# Patient Record
Sex: Male | Born: 1985
Health system: Southern US, Community
[De-identification: ages and names within clinical notes are randomized; demographics above are authoritative.]

## PROBLEM LIST (undated history)

## (undated) DIAGNOSIS — I82409 Acute embolism and thrombosis of unspecified deep veins of unspecified lower extremity: Secondary | ICD-10-CM

## (undated) DIAGNOSIS — Z8659 Personal history of other mental and behavioral disorders: Secondary | ICD-10-CM

## (undated) DIAGNOSIS — G8929 Other chronic pain: Secondary | ICD-10-CM

## (undated) DIAGNOSIS — J4520 Mild intermittent asthma, uncomplicated: Secondary | ICD-10-CM

## (undated) DIAGNOSIS — M549 Dorsalgia, unspecified: Secondary | ICD-10-CM

## (undated) DIAGNOSIS — Z86718 Personal history of other venous thrombosis and embolism: Secondary | ICD-10-CM

## (undated) DIAGNOSIS — F411 Generalized anxiety disorder: Secondary | ICD-10-CM

## (undated) DIAGNOSIS — Z973 Presence of spectacles and contact lenses: Secondary | ICD-10-CM

## (undated) DIAGNOSIS — G43909 Migraine, unspecified, not intractable, without status migrainosus: Secondary | ICD-10-CM

## (undated) DIAGNOSIS — I2699 Other pulmonary embolism without acute cor pulmonale: Secondary | ICD-10-CM

## (undated) HISTORY — PX: UMBILICAL HERNIA REPAIR: SHX196

## (undated) HISTORY — DX: Dorsalgia, unspecified: M54.9

## (undated) HISTORY — DX: Other chronic pain: G89.29

---
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
## 2006-01-03 ENCOUNTER — Emergency Department (HOSPITAL_COMMUNITY): Admission: EM | Admit: 2006-01-03 | Discharge: 2006-01-03 | Payer: Self-pay | Admitting: Emergency Medicine

## 2006-07-27 ENCOUNTER — Emergency Department (HOSPITAL_COMMUNITY): Admission: EM | Admit: 2006-07-27 | Discharge: 2006-07-27 | Payer: Self-pay | Admitting: Emergency Medicine

## 2007-08-07 ENCOUNTER — Emergency Department (HOSPITAL_COMMUNITY): Admission: EM | Admit: 2007-08-07 | Discharge: 2007-08-07 | Payer: Self-pay | Admitting: Emergency Medicine

## 2007-09-05 ENCOUNTER — Emergency Department (HOSPITAL_COMMUNITY): Admission: EM | Admit: 2007-09-05 | Discharge: 2007-09-05 | Payer: Self-pay | Admitting: Emergency Medicine

## 2007-11-05 ENCOUNTER — Emergency Department (HOSPITAL_COMMUNITY): Admission: EM | Admit: 2007-11-05 | Discharge: 2007-11-05 | Payer: Self-pay | Admitting: Family Medicine

## 2008-03-04 ENCOUNTER — Emergency Department (HOSPITAL_COMMUNITY): Admission: EM | Admit: 2008-03-04 | Discharge: 2008-03-04 | Payer: Self-pay | Admitting: Emergency Medicine

## 2008-08-10 ENCOUNTER — Emergency Department (HOSPITAL_COMMUNITY): Admission: EM | Admit: 2008-08-10 | Discharge: 2008-08-11 | Payer: Self-pay | Admitting: Emergency Medicine

## 2008-09-12 ENCOUNTER — Encounter: Admission: RE | Admit: 2008-09-12 | Discharge: 2008-10-14 | Payer: Self-pay | Admitting: Orthopaedic Surgery

## 2008-12-18 ENCOUNTER — Emergency Department (HOSPITAL_COMMUNITY): Admission: EM | Admit: 2008-12-18 | Discharge: 2008-12-19 | Payer: Self-pay | Admitting: Emergency Medicine

## 2008-12-26 ENCOUNTER — Emergency Department (HOSPITAL_COMMUNITY): Admission: EM | Admit: 2008-12-26 | Discharge: 2008-12-26 | Payer: Self-pay | Admitting: Emergency Medicine

## 2009-01-17 ENCOUNTER — Emergency Department (HOSPITAL_COMMUNITY): Admission: EM | Admit: 2009-01-17 | Discharge: 2009-01-17 | Payer: Self-pay | Admitting: Dietician

## 2009-01-27 ENCOUNTER — Emergency Department (HOSPITAL_COMMUNITY): Admission: EM | Admit: 2009-01-27 | Discharge: 2009-01-27 | Payer: Self-pay | Admitting: Ophthalmology

## 2009-06-06 DIAGNOSIS — Z86711 Personal history of pulmonary embolism: Secondary | ICD-10-CM

## 2009-06-06 DIAGNOSIS — D6859 Other primary thrombophilia: Secondary | ICD-10-CM

## 2009-06-06 DIAGNOSIS — Z7901 Long term (current) use of anticoagulants: Secondary | ICD-10-CM

## 2009-06-06 HISTORY — DX: Personal history of pulmonary embolism: Z86.711

## 2009-06-06 HISTORY — DX: Long term (current) use of anticoagulants: Z79.01

## 2009-06-06 HISTORY — DX: Other primary thrombophilia: D68.59

## 2009-06-24 ENCOUNTER — Encounter (INDEPENDENT_AMBULATORY_CARE_PROVIDER_SITE_OTHER): Payer: Self-pay | Admitting: Internal Medicine

## 2009-06-24 ENCOUNTER — Ambulatory Visit: Payer: Self-pay | Admitting: Vascular Surgery

## 2009-06-24 ENCOUNTER — Inpatient Hospital Stay (HOSPITAL_COMMUNITY): Admission: EM | Admit: 2009-06-24 | Discharge: 2009-06-27 | Payer: Self-pay | Admitting: Emergency Medicine

## 2009-06-25 ENCOUNTER — Encounter: Admission: AD | Admit: 2009-06-25 | Discharge: 2009-06-26 | Payer: Self-pay | Admitting: Internal Medicine

## 2009-06-27 ENCOUNTER — Ambulatory Visit: Payer: Self-pay | Admitting: Oncology

## 2009-07-04 ENCOUNTER — Ambulatory Visit: Payer: Self-pay | Admitting: Internal Medicine

## 2009-07-04 DIAGNOSIS — I2699 Other pulmonary embolism without acute cor pulmonale: Secondary | ICD-10-CM

## 2009-07-04 DIAGNOSIS — I82409 Acute embolism and thrombosis of unspecified deep veins of unspecified lower extremity: Secondary | ICD-10-CM | POA: Insufficient documentation

## 2009-07-04 DIAGNOSIS — F17200 Nicotine dependence, unspecified, uncomplicated: Secondary | ICD-10-CM

## 2009-07-05 ENCOUNTER — Encounter: Payer: Self-pay | Admitting: Internal Medicine

## 2009-07-05 ENCOUNTER — Telehealth: Payer: Self-pay | Admitting: Internal Medicine

## 2009-07-05 ENCOUNTER — Ambulatory Visit (HOSPITAL_COMMUNITY): Admission: RE | Admit: 2009-07-05 | Discharge: 2009-07-05 | Payer: Self-pay | Admitting: Internal Medicine

## 2009-07-10 ENCOUNTER — Telehealth (INDEPENDENT_AMBULATORY_CARE_PROVIDER_SITE_OTHER): Payer: Self-pay | Admitting: *Deleted

## 2009-07-11 ENCOUNTER — Encounter: Payer: Self-pay | Admitting: Internal Medicine

## 2009-07-11 ENCOUNTER — Ambulatory Visit: Payer: Self-pay | Admitting: Physician Assistant

## 2009-07-11 DIAGNOSIS — D6859 Other primary thrombophilia: Secondary | ICD-10-CM | POA: Insufficient documentation

## 2009-07-11 DIAGNOSIS — N411 Chronic prostatitis: Secondary | ICD-10-CM | POA: Insufficient documentation

## 2009-07-11 LAB — CONVERTED CEMR LAB
ALT: 45 units/L (ref 0–53)
Albumin: 4.3 g/dL (ref 3.5–5.2)
Alkaline Phosphatase: 49 units/L (ref 39–117)
Basophils Absolute: 0 10*3/uL (ref 0.0–0.1)
Basophils Relative: 0 % (ref 0–1)
Benzodiazepines.: NEGATIVE
CO2: 26 meq/L (ref 19–32)
Chlamydia, Swab/Urine, PCR: NEGATIVE
Eosinophils Relative: 1 % (ref 0–5)
HCT: 46.9 % (ref 39.0–52.0)
Hemoglobin: 15.9 g/dL (ref 13.0–17.0)
MCHC: 33.9 g/dL (ref 30.0–36.0)
MCV: 94.2 fL (ref 78.0–100.0)
Marijuana Metabolite: POSITIVE — AB
Monocytes Absolute: 0.4 10*3/uL (ref 0.1–1.0)
Opiate Screen, Urine: NEGATIVE
Phencyclidine (PCP): NEGATIVE
Propoxyphene: NEGATIVE
RBC: 4.98 M/uL (ref 4.22–5.81)
Sodium: 140 meq/L (ref 135–145)
Total Bilirubin: 0.5 mg/dL (ref 0.3–1.2)

## 2009-07-12 ENCOUNTER — Encounter: Payer: Self-pay | Admitting: Physician Assistant

## 2009-07-14 ENCOUNTER — Encounter (INDEPENDENT_AMBULATORY_CARE_PROVIDER_SITE_OTHER): Payer: Self-pay | Admitting: *Deleted

## 2009-07-18 ENCOUNTER — Encounter (INDEPENDENT_AMBULATORY_CARE_PROVIDER_SITE_OTHER): Payer: Self-pay | Admitting: Nurse Practitioner

## 2009-07-18 ENCOUNTER — Encounter: Payer: Self-pay | Admitting: Physician Assistant

## 2009-07-19 ENCOUNTER — Encounter: Payer: Self-pay | Admitting: Physician Assistant

## 2009-07-24 ENCOUNTER — Encounter: Payer: Self-pay | Admitting: Physician Assistant

## 2009-07-28 ENCOUNTER — Encounter: Payer: Self-pay | Admitting: Physician Assistant

## 2009-08-08 ENCOUNTER — Ambulatory Visit: Payer: Self-pay | Admitting: Internal Medicine

## 2009-08-10 ENCOUNTER — Ambulatory Visit: Payer: Self-pay | Admitting: Physician Assistant

## 2009-08-10 ENCOUNTER — Ambulatory Visit: Payer: Self-pay | Admitting: Oncology

## 2009-08-11 LAB — CONVERTED CEMR LAB: GC Probe Amp, Genital: NEGATIVE

## 2009-08-14 ENCOUNTER — Encounter: Payer: Self-pay | Admitting: Physician Assistant

## 2009-08-14 ENCOUNTER — Encounter: Payer: Self-pay | Admitting: Internal Medicine

## 2009-08-14 LAB — CBC WITH DIFFERENTIAL/PLATELET
BASO%: 0.9 % (ref 0.0–2.0)
EOS%: 1.7 % (ref 0.0–7.0)
Eosinophils Absolute: 0.1 10*3/uL (ref 0.0–0.5)
LYMPH%: 49.8 % — ABNORMAL HIGH (ref 14.0–49.0)
MCHC: 34.2 g/dL (ref 32.0–36.0)
MCV: 98.1 fL — ABNORMAL HIGH (ref 79.3–98.0)
MONO%: 12.6 % (ref 0.0–14.0)
NEUT#: 1.6 10*3/uL (ref 1.5–6.5)
Platelets: 168 10*3/uL (ref 140–400)
RBC: 4.38 10*6/uL (ref 4.20–5.82)
RDW: 14 % (ref 11.0–14.6)

## 2009-08-17 LAB — COMPREHENSIVE METABOLIC PANEL
ALT: 19 U/L (ref 0–53)
AST: 30 U/L (ref 0–37)
Albumin: 4.3 g/dL (ref 3.5–5.2)
Alkaline Phosphatase: 45 U/L (ref 39–117)
Potassium: 4.2 mEq/L (ref 3.5–5.3)
Sodium: 138 mEq/L (ref 135–145)
Total Bilirubin: 0.5 mg/dL (ref 0.3–1.2)
Total Protein: 6.6 g/dL (ref 6.0–8.3)

## 2009-08-17 LAB — HYPERCOAGULABLE PANEL, COMPREHENSIVE
Anticardiolipin IgG: 1 GPL U/mL (ref <23)
Beta-2 Glyco I IgG: 0 G Units (ref <20)
DRVVT 1:1 Mix: 66.2 secs — ABNORMAL HIGH (ref 36.2–44.3)
DRVVT: 108.5 secs — ABNORMAL HIGH (ref 36.2–44.3)
Drvvt confirmation: 1.76 Ratio — ABNORMAL HIGH (ref <1.21)
Homocysteine: 8.6 umol/L (ref 4.0–15.4)
PTT Lupus Anticoagulant: 52.5 secs — ABNORMAL HIGH (ref 32.0–43.4)
Protein C Activity: 127 % (ref 75–133)
Protein C, Total: 67 % — ABNORMAL LOW (ref 70–140)
Protein S Ag, Total: 49 % — ABNORMAL LOW (ref 70–140)

## 2009-08-31 ENCOUNTER — Ambulatory Visit: Payer: Self-pay | Admitting: Internal Medicine

## 2009-08-31 ENCOUNTER — Telehealth (INDEPENDENT_AMBULATORY_CARE_PROVIDER_SITE_OTHER): Payer: Self-pay | Admitting: *Deleted

## 2009-08-31 ENCOUNTER — Ambulatory Visit: Payer: Self-pay

## 2009-08-31 ENCOUNTER — Encounter: Payer: Self-pay | Admitting: Internal Medicine

## 2009-09-08 ENCOUNTER — Telehealth: Payer: Self-pay | Admitting: Internal Medicine

## 2009-09-27 ENCOUNTER — Encounter: Payer: Self-pay | Admitting: Physician Assistant

## 2009-10-07 ENCOUNTER — Emergency Department (HOSPITAL_COMMUNITY): Admission: EM | Admit: 2009-10-07 | Discharge: 2009-10-07 | Payer: Self-pay | Admitting: Emergency Medicine

## 2009-10-07 ENCOUNTER — Encounter: Payer: Self-pay | Admitting: Physician Assistant

## 2009-10-13 ENCOUNTER — Ambulatory Visit: Payer: Self-pay | Admitting: Physician Assistant

## 2009-10-13 DIAGNOSIS — M79609 Pain in unspecified limb: Secondary | ICD-10-CM | POA: Insufficient documentation

## 2009-11-02 ENCOUNTER — Ambulatory Visit: Payer: Self-pay | Admitting: Oncology

## 2009-11-02 ENCOUNTER — Ambulatory Visit (HOSPITAL_COMMUNITY): Admission: RE | Admit: 2009-11-02 | Discharge: 2009-11-02 | Payer: Self-pay | Admitting: Physician Assistant

## 2009-11-02 ENCOUNTER — Ambulatory Visit: Payer: Self-pay | Admitting: Internal Medicine

## 2009-11-02 ENCOUNTER — Telehealth: Payer: Self-pay | Admitting: Physician Assistant

## 2009-11-20 ENCOUNTER — Telehealth: Payer: Self-pay | Admitting: Physician Assistant

## 2009-11-23 ENCOUNTER — Encounter: Payer: Self-pay | Admitting: Physician Assistant

## 2009-11-23 ENCOUNTER — Encounter: Admission: RE | Admit: 2009-11-23 | Discharge: 2009-12-20 | Payer: Self-pay | Admitting: Internal Medicine

## 2009-11-24 ENCOUNTER — Ambulatory Visit: Payer: Self-pay | Admitting: Physician Assistant

## 2009-11-24 DIAGNOSIS — T7840XA Allergy, unspecified, initial encounter: Secondary | ICD-10-CM | POA: Insufficient documentation

## 2009-11-24 DIAGNOSIS — R209 Unspecified disturbances of skin sensation: Secondary | ICD-10-CM | POA: Insufficient documentation

## 2009-11-25 ENCOUNTER — Encounter: Payer: Self-pay | Admitting: Physician Assistant

## 2009-11-28 LAB — CONVERTED CEMR LAB
Basophils Absolute: 0 10*3/uL (ref 0.0–0.1)
HCT: 45 % (ref 39.0–52.0)
MCV: 95.5 fL (ref 78.0–100.0)
Monocytes Relative: 12 % (ref 3–12)
Neutro Abs: 1.6 10*3/uL — ABNORMAL LOW (ref 1.7–7.7)
Neutrophils Relative %: 30 % — ABNORMAL LOW (ref 43–77)
Platelets: 160 10*3/uL (ref 150–400)
RBC Folate: 434 ng/mL (ref 180–600)
RDW: 13.4 % (ref 11.5–15.5)
Vitamin B-12: 416 pg/mL (ref 211–911)

## 2009-11-29 ENCOUNTER — Encounter (INDEPENDENT_AMBULATORY_CARE_PROVIDER_SITE_OTHER): Payer: Self-pay | Admitting: *Deleted

## 2009-12-20 ENCOUNTER — Telehealth (INDEPENDENT_AMBULATORY_CARE_PROVIDER_SITE_OTHER): Payer: Self-pay | Admitting: Nurse Practitioner

## 2009-12-20 ENCOUNTER — Encounter: Payer: Self-pay | Admitting: Physician Assistant

## 2009-12-25 ENCOUNTER — Ambulatory Visit: Payer: Self-pay | Admitting: Oncology

## 2009-12-26 ENCOUNTER — Encounter: Payer: Self-pay | Admitting: Physician Assistant

## 2009-12-26 LAB — CBC WITH DIFFERENTIAL/PLATELET
Basophils Absolute: 0 10*3/uL (ref 0.0–0.1)
Eosinophils Absolute: 0.1 10*3/uL (ref 0.0–0.5)
HCT: 44.1 % (ref 38.4–49.9)
HGB: 15.5 g/dL (ref 13.0–17.1)
LYMPH%: 58.1 % — ABNORMAL HIGH (ref 14.0–49.0)
MCV: 94.2 fL (ref 79.3–98.0)
MONO#: 0.5 10*3/uL (ref 0.1–0.9)
MONO%: 9.7 % (ref 0.0–14.0)
NEUT#: 1.5 10*3/uL (ref 1.5–6.5)
Platelets: 154 10*3/uL (ref 140–400)
WBC: 4.9 10*3/uL (ref 4.0–10.3)

## 2009-12-26 LAB — PROTHROMBIN TIME
INR: 1.11 (ref <1.50)
Prothrombin Time: 14.5 seconds (ref 11.6–15.2)

## 2009-12-26 LAB — APTT: aPTT: 33 seconds (ref 24–37)

## 2010-01-03 ENCOUNTER — Encounter: Payer: Self-pay | Admitting: Physician Assistant

## 2010-01-03 ENCOUNTER — Encounter: Payer: Self-pay | Admitting: Internal Medicine

## 2010-01-03 LAB — CBC WITH DIFFERENTIAL/PLATELET
Basophils Absolute: 0 10*3/uL (ref 0.0–0.1)
EOS%: 1.8 % (ref 0.0–7.0)
Eosinophils Absolute: 0.1 10*3/uL (ref 0.0–0.5)
HGB: 16.3 g/dL (ref 13.0–17.1)
MCH: 33 pg (ref 27.2–33.4)
MONO%: 10.3 % (ref 0.0–14.0)
NEUT#: 1.6 10*3/uL (ref 1.5–6.5)
RBC: 4.94 10*6/uL (ref 4.20–5.82)
RDW: 13.4 % (ref 11.0–14.6)
lymph#: 2.4 10*3/uL (ref 0.9–3.3)
nRBC: 0 % (ref 0–0)

## 2010-01-03 LAB — PROTIME-INR
INR: 1.3 — ABNORMAL LOW (ref 2.00–3.50)
Protime: 15.6 Seconds — ABNORMAL HIGH (ref 10.6–13.4)

## 2010-01-04 ENCOUNTER — Telehealth: Payer: Self-pay | Admitting: Internal Medicine

## 2010-01-05 ENCOUNTER — Encounter: Payer: Self-pay | Admitting: Physician Assistant

## 2010-01-10 LAB — PROTIME-INR
INR: 2.1 (ref 2.00–3.50)
Protime: 25.2 Seconds — ABNORMAL HIGH (ref 10.6–13.4)

## 2010-01-10 LAB — CBC WITH DIFFERENTIAL/PLATELET
BASO%: 0.5 % (ref 0.0–2.0)
HCT: 43.9 % (ref 38.4–49.9)
MCHC: 33.4 g/dL (ref 32.0–36.0)
MONO#: 0.5 10*3/uL (ref 0.1–0.9)
NEUT%: 31.4 % — ABNORMAL LOW (ref 39.0–75.0)
RDW: 13.7 % (ref 11.0–14.6)
WBC: 3.8 10*3/uL — ABNORMAL LOW (ref 4.0–10.3)
lymph#: 2.1 10*3/uL (ref 0.9–3.3)

## 2010-01-24 ENCOUNTER — Ambulatory Visit: Payer: Self-pay | Admitting: Oncology

## 2010-01-24 ENCOUNTER — Encounter: Payer: Self-pay | Admitting: Physician Assistant

## 2010-01-24 ENCOUNTER — Encounter: Payer: Self-pay | Admitting: Internal Medicine

## 2010-01-24 LAB — PROTIME-INR: Protime: 16.8 Seconds — ABNORMAL HIGH (ref 10.6–13.4)

## 2010-02-05 ENCOUNTER — Ambulatory Visit: Payer: Self-pay | Admitting: Physician Assistant

## 2010-02-05 DIAGNOSIS — M545 Low back pain: Secondary | ICD-10-CM | POA: Insufficient documentation

## 2010-02-05 DIAGNOSIS — L708 Other acne: Secondary | ICD-10-CM | POA: Insufficient documentation

## 2010-02-06 LAB — PROTIME-INR
INR: 1.9 — ABNORMAL LOW (ref 2.00–3.50)
Protime: 22.8 Seconds — ABNORMAL HIGH (ref 10.6–13.4)

## 2010-02-19 ENCOUNTER — Encounter: Payer: Self-pay | Admitting: Physician Assistant

## 2010-02-20 ENCOUNTER — Encounter: Payer: Self-pay | Admitting: Physician Assistant

## 2010-02-20 LAB — PROTIME-INR: INR: 1.5 — ABNORMAL LOW (ref 2.00–3.50)

## 2010-02-23 ENCOUNTER — Ambulatory Visit: Payer: Self-pay | Admitting: Oncology

## 2010-02-27 LAB — PROTIME-INR

## 2010-03-14 LAB — PROTIME-INR
INR: 2 (ref 2.00–3.50)
Protime: 24 Seconds — ABNORMAL HIGH (ref 10.6–13.4)

## 2010-03-20 ENCOUNTER — Emergency Department (HOSPITAL_COMMUNITY)
Admission: EM | Admit: 2010-03-20 | Discharge: 2010-03-20 | Payer: Self-pay | Source: Home / Self Care | Admitting: Emergency Medicine

## 2010-03-21 LAB — PROTIME-INR
INR: 1.6 — ABNORMAL LOW (ref 2.00–3.50)
Protime: 19.2 Seconds — ABNORMAL HIGH (ref 10.6–13.4)

## 2010-03-22 ENCOUNTER — Ambulatory Visit
Admission: RE | Admit: 2010-03-22 | Discharge: 2010-03-22 | Payer: Self-pay | Source: Home / Self Care | Admitting: Oncology

## 2010-03-22 ENCOUNTER — Ambulatory Visit: Payer: Self-pay | Admitting: Vascular Surgery

## 2010-03-22 ENCOUNTER — Encounter: Payer: Self-pay | Admitting: Oncology

## 2010-03-26 ENCOUNTER — Ambulatory Visit: Payer: Self-pay | Admitting: Oncology

## 2010-04-12 LAB — PROTIME-INR: INR: 1.5 — ABNORMAL LOW (ref 2.00–3.50)

## 2010-04-25 ENCOUNTER — Ambulatory Visit: Payer: Self-pay | Admitting: Oncology

## 2010-04-26 LAB — PROTIME-INR: INR: 1.8 — ABNORMAL LOW (ref 2.00–3.50)

## 2010-05-03 LAB — PROTIME-INR: Protime: 15.6 Seconds — ABNORMAL HIGH (ref 10.6–13.4)

## 2010-05-25 ENCOUNTER — Ambulatory Visit: Payer: Self-pay | Admitting: Oncology

## 2010-06-05 LAB — PROTIME-INR
INR: 1.5 — ABNORMAL LOW (ref 2.00–3.50)
Protime: 18 Seconds — ABNORMAL HIGH (ref 10.6–13.4)

## 2010-06-07 NOTE — Letter (Signed)
Summary: *HSN Results Follow up  [facility]  [address] [place], [place] [address]   Phone: [phone]  Fax: [phone]      11/29/2009   Haruo JEVANTE HOLLIBAUGH [address] [place], [place]  [address]   Dear  Mr. Bartlett Wallis,                            ____S.Drinkard,FNP   ____D. Gore,FNP       ____B. McPherson,MD   ____V. Rankins,MD    ____E. Mulberry,MD    ____N. Daphine Deutscher, FNP  ____D. Reche Dixon, MD    ____K. Philipp Deputy, MD    ____Other     This letter is to inform you that your recent test(s):  _______Pap Smear    __X_____Lab Test     _______X-ray    ____X___ is within acceptable limits  _______ requires a medication change  _______ requires a follow-up lab visit  _______ requires a follow-up visit with your provider   Comments:   Your blood counts and vitamin levels are normal.       _________________________________________________________ If you have any questions, please contact our office                     Sincerely,  Arm[place] Shannon [facility]

## 2010-06-07 NOTE — Letter (Signed)
Summary: [facility]  [address]   [place], [place] [address]   Phone: [phone]  Fax: [phone]    07/11/2009  [name] [address] [place], [place]  [address]  Dear [name],           Sincerely,   Kalman Shan MD

## 2010-06-07 NOTE — Progress Notes (Signed)
  Phone Note Outgoing Call   Summary of Call: xrays done recently ok suggest he go to PT referral in system send to Arna Medici once he is notified Initial call taken by: Brynda Rim,  November 02, 2009 10:23 PM  Follow-up for Phone Call        Left message on answering machine for pt to call back.Marland KitchenMarland KitchenMarland KitchenArmenia Shannon  November 03, 2009 9:32 AM   pt is aware... [place] Shannon  November 07, 2009 8:19 AM        Impression & Recommendations:  Problem # 1:  ARM PAIN, RIGHT (ICD-729.5)  refer to PT xrays neg. except poss old fx in hand (well healed)  Orders: Physical Therapy Referral (PT)  Complete Medication List: 1)  Ibuprofen 200 Mg Tabs (Ibuprofen) .... As needed 2)  Rivaroxaban 20mg   .... Take 1 tablet by mouth once a day  (study drug) 3)  Flexeril 10 Mg Tabs (Cyclobenzaprine hcl) .... Take 1 tab by mouth at bedtime as needed for muscle pain 4)  Tramadol Hcl 50 Mg Tabs (Tramadol hcl) .... Take 1 tablet by mouth three times a day as needed for severe pain

## 2010-06-07 NOTE — Letter (Signed)
Summary: Handout Printed  Printed Handout:  - Acne, Brief
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
## 2010-06-07 NOTE — Progress Notes (Signed)
Summary: RIGHT SIDE OF FACE SWOLLEN  Phone Note Call from Patient Call back at Home Phone [phone]   Reason for Call: Talk to Nurse Summary of Call: WEAVER PT. Nicholas Glover NOTICED THIS MORNING WHEN HE GOT OFF WORK THE THE RIGHT SIDE OF HIS FACE IS SWOLLEN AND HE IS AKSING THE QUESTION IS IT SERIOUS OR IF IT HAS SOMETHING TO DO WITH THE BLOOD CLOTS OR THE PAIN IN HIS ARM. Initial call taken by: Nicholas Glover,  November 20, 2009 10:02 AM  Follow-up for Phone Call        Left message on answering machine for pt to call back.Marland KitchenMarland KitchenMarland KitchenArmenia Glover  November 20, 2009 11:28 AM   Left message on answering machine for pt to call back.Marland KitchenMarland KitchenMarland KitchenArmenia Glover  November 21, 2009 12:25 PM   Additional Follow-up for Phone Call Additional follow up Details #1::        spoke with pt and he said he has been having pain in his right arm...Marland KitchenMarland Kitchen pt says his face is still swollen a little.... pt has not tried anything to keep swelling down.... 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   Additional Follow-up by: Nicholas Glover,  November 21, 2009 4:27 PM    Additional Follow-up for Phone Call Additional follow up Details #2::    He would need an appt. for me to see him. If worrisome, go to urgent care. Nicholas Newcomer PA-C  November 21, 2009 4:47 PM   pt has appt scheduled....... Nicholas Glover  November 22, 2009 9:25 AM
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
## 2010-06-07 NOTE — Progress Notes (Signed)
Summary: BLOOD TREATMENT  Phone Note Call from Patient Call back at [phone]   Summary of Call: THE PT  WANTS TO MAKE SURE IF HE SHOULD CONTINUE THE BLOOD TREATMENT.  PT WENT TODAY TO [facility] AND SUGGESTED HIM TO CALL PRIMARY PROVIDER. WEAVER PA-C DEPARTMENT. WEAVER PA-C Initial call taken by: Manon Hilding,  December 20, 2009 10:52 AM  Follow-up for Phone Call        Does this need to wait for Lorin Picket to address?  Dutch Quint RN  December 20, 2009 2:20 PM   Additional Follow-up for Phone Call Additional follow up Details #1::        In reviewing notes pt is supposed to speak with  Dr. Donnie Coffin early agust 2011 to determine how long he needs to take coumadin.  he was seen by that provider and had some testing done to determine if he would need lifelong coumadin  (I don't see results of testing in EMR) Additional Follow-up by: Lehman Prom FNP,  December 20, 2009 2:34 PM    Additional Follow-up for Phone Call Additional follow up Details #2::    Advised of provider's response -- pt. will follow-up with Dr. Donnie Coffin.   Follow-up by: Dutch Quint RN,  December 20, 2009 5:03 PM
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
## 2010-06-07 NOTE — Letter (Signed)
Summary: [facility]  [facility]   Imported By: Sherian Rein 02/28/2010 14:55:52  _____________________________________________________________________  External Attachment:    Type:   Image     Comment:   External Document

## 2010-06-07 NOTE — Letter (Signed)
[facility]   Imported By: Lester [place] 09/19/2009 08:41:09  _____________________________________________________________________  External Attachment:    Type:   Image     Comment:   External Document

## 2010-06-07 NOTE — Miscellaneous (Signed)
Summary: Orders Update  Clinical Lists Changes  Orders: Added new Test order of Venous Duplex Lower Extremity (Venous Duplex Lower) - Signed 

## 2010-06-07 NOTE — Progress Notes (Signed)
Summary: nos appt  Phone Note Call from Patient   Caller: [email or web address]  Call For: [email or web address] Summary of Call: [facility] nos from 8/31 to 9/14 @ 3:10p. Initial call taken by: Darletta Moll,  January 04, 2010 2:06 PM     Appended Document: nos appt Candise Bowens, Last OV I told him no need to fu in pulmonary. He needs a coumadin clinic and he needs to see Dr Donnie Coffin in heme to determine lenght of anticoagulation. Pls confirm those 2 with patient and cancel appt to see me unless he wants to   Appended Document: nos appt AT[facility] home number but received a message stating that number was not valid. I also called work number but received a Engineer, technical sales. I WCB.   Appended Document: nos appt spoke with pt and advised. He is seeing Dr. Donnie Coffin and coumadin clinic. I advised no need to follow-up with MR unless he has pulmonary issues. Appt cancelled for 01-17-10.

## 2010-06-07 NOTE — Letter (Signed)
Summary: REQUESTING RECORDS FROM [facility]  REQUESTING RECORDS FROM [facility]   Imported By: Arta Bruce 09/25/2009 11:12:37  _____________________________________________________________________  External Attachment:    Type:   Image     Comment:   External Document
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
## 2010-06-07 NOTE — Progress Notes (Signed)
Summary: u/s results  Phone Note From Other Clinic   Caller: Dois Davenport with [facility] Call For: MR Summary of Call: Dois Davenport called with [place] report of pt legs. She states all clots are clear except for the one at pt left posterior calf, but this is believed to be a superficial clot, not a dvt. they wanted to know if ok tolet pt leave. I aske KC the doc on call today and he staets ok for pt to leave. Dois Davenport notified.  Initial call taken by: Carron Curie CMA,  July 05, 2009 11:32 AM     Appended Document: u/s results ok for patient to leave. Please tell patient to take the pain meds ibuprofen or tylenol as advised and rest of clot shuld clear up with time. Pls tell him to keep appt withDr Donnie Coffin. I spoke to Dr. Donnie Coffin early hours today. He said he did not do note inpatient becuase patient left/leaving. He plans to see patient soon.   Appended Document: u/s results pt advised.
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
## 2010-06-07 NOTE — Letter (Signed)
Summary:  Cancer Center  [facility]   Imported By: Lennie Odor 01/22/2010 14:04:35  _____________________________________________________________________  External Attachment:    Type:   Image     Comment:   External Document
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
## 2010-06-07 NOTE — Progress Notes (Signed)
Summary: return to work/ restrictions  Phone Note Call from Patient Call back at Home Phone [phone]   Caller: Patient Call For: ramaswamy Summary of Call: pt wants to know 1) when he is to return to work and 2) if there are any restrictions. he will need a note for this. call pt (he just left our office).  pt was seen 07/04/2009  Initial call taken by: Tivis Ringer, CNA,  July 10, 2009 11:48 AM  Follow-up for Phone Call        please advise. Carron Curie CMA  July 10, 2009 12:07 PM   Additional Follow-up for Phone Call Additional follow up Details #1::        done. see separate letter. Additional Follow-up by: Kalman Shan MD,  July 11, 2009 12:08 AM    Additional Follow-up for Phone Call Additional follow up Details #2::    letter printed and is at front desk for pt to pick up.  pt aware.  Aundra Millet Reynolds LPN  July 12, 7423 8:48 AM
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
## 2010-06-07 NOTE — Letter (Signed)
[facility]  [facility]   Imported By: Arta Bruce 02/20/2010 15:29:29  _____________________________________________________________________  External Attachment:    Type:   Image     Comment:   External Document

## 2010-06-07 NOTE — Progress Notes (Signed)
Summary: f/u visit for LLE pain, research patient, doppler scheduled  Phone Note Call from Patient   Caller: Patient Call For: Parrett Summary of Call: Patient had follow up visit with research coordinator, complained of LLE pain.  Coordinator spoke with Dr. Marchelle Gearing; MD advised patient to come to hospital for a doppler and to f/u with T. Parrett, NR. Initial call taken by: Chase Caller,  August 31, 2009 8:47 AM  Follow-up for Phone Call        Per TP- okay to send order for dopplers and then followup with her afterwards. I spoke with the pt and he agrees to this plan.  Order was sent to [facility]. Follow-up by: Vernie Murders,  August 31, 2009 10:03 AM

## 2010-06-07 NOTE — Miscellaneous (Signed)
Summary: Rehab Report/initial summary  Rehab Report/initial summary   Imported By: Arta Bruce 11/27/2009 10:45:23  _____________________________________________________________________  External Attachment:    Type:   Image     Comment:   External Document

## 2010-06-07 NOTE — Miscellaneous (Signed)
  Clinical Lists Changes  Problems: Assessed PRIMARY HYPERCOAGULABLE STATE as comment only - Dr. 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 Donnie Coffin rec lifelong anticoag  Complete Medication List: 1)  Ibuprofen 200 Mg Tabs (Ibuprofen) .... As needed 2)  Flexeril 10 Mg Tabs (Cyclobenzaprine hcl) .... Take 1 tab by mouth at bedtime as needed for muscle pain 3)  Tramadol Hcl 50 Mg Tabs (Tramadol hcl) .... Take 1 tablet by mouth three times a day as needed for severe pain 4)  Arixtra 7.5 Mg/0.20ml Soln (Fondaparinux sodium) 5)  Coumadin 7.5 Mg Tabs (Warfarin sodium)

## 2010-06-07 NOTE — Miscellaneous (Signed)
Summary: DISCHARGE SUMMARY  DISCHARGE SUMMARY   Imported By: Arta Bruce 01/01/2010 16:58:54  _____________________________________________________________________  External Attachment:    Type:   Image     Comment:   External Document

## 2010-06-07 NOTE — Letter (Signed)
Summary: [facility]  [address]   [place], [place] [address]   Phone: [phone]  Fax: [phone]    07/10/2009  Nicholas Glover [address] [place], [place]  [address]  Dear Mr. 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 For diagnosis of Pulmonary Embolism on blood thinner treatment the restrictions are mainly as follows. The first is to  avoid any activity that can put you at risk of injury, trauma, fall or being hurt or something falling on you. This is  because with the blood thinner you can bleed easily. So, you have to be cautious of such activities. For example you do not want to play basketball or football but walking, running should be okay. Be extra careful for example if the floor is wet. You do not want to slip and fall. Routine work is okay.  Also, you do not want to be sedantary and sitting idle. This will increase the risk for blood clots.   I hope this clarifes. 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

## 2010-06-07 NOTE — Letter (Signed)
[facility]  [facility]   Imported By: Arta Bruce 02/23/2010 12:43:53  _____________________________________________________________________  External Attachment:    Type:   Image     Comment:   External Document
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
## 2010-06-07 NOTE — Letter (Signed)
Summary: MAILED REQUESTED RECORDS TO [employer] [facility]  MAILED REQUESTED RECORDS TO [employer] [facility]   Imported By: Arta Bruce 02/19/2010 12:50:54  _____________________________________________________________________  External Attachment:    Type:   Image     Comment:   External Document

## 2010-06-07 NOTE — Letter (Signed)
Summary[facility]  [facility]   Imported By: Arta Bruce 01/11/2010 15:20:30  _____________________________________________________________________  External Attachment:    Type:   Image     Comment:   External Document

## 2010-06-07 NOTE — Letter (Signed)
Summary: REQUESTING RECORDS FROM [facility]  REQUESTING RECORDS FROM  [facility]   Imported By: Arta Bruce 09/25/2009 11:06:55  _____________________________________________________________________  External Attachment:    Type:   Image     Comment:   External Document
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
## 2010-06-07 NOTE — Letter (Signed)
Summary: RECEIVED RECORDS FROM [facility] RECORDS FROM [facility]   Imported By: Arta Bruce 10/09/2009 11:36:08  _____________________________________________________________________  External Attachment:    Type:   Image     Comment:   External Document

## 2010-06-07 NOTE — Letter (Signed)
Summary: [facility]/NEW PT EVAL  [facility]NEW PT EVAL   Imported By: Arta Bruce 09/28/2009 11:27:46  _____________________________________________________________________  External Attachment:    Type:   Image     Comment:   External Document

## 2010-06-07 NOTE — Letter (Signed)
Summary: [facility]//PROGRESS NOTE  [facility]//PROGRESS NOTE   Imported By: Arta Bruce 02/20/2010 15:27:27  _____________________________________________________________________  External Attachment:    Type:   Image     Comment:   External Document

## 2010-06-07 NOTE — Letter (Signed)
Summary: *HSN Results Follow up  [facility]  [address] [place], [place] [address]   Phone: [phone]  Fax: [phone]      07/14/2009   Nicholas Glover [address] [place], [place]  [address]   Dear  [name],                            ____S.Drinkard,FNP   ____D. Gore,FNP       ____B. McPherson,MD   ____V. Rankins,MD    ____E. Mulberry,MD    ____N. Daphine Deutscher, FNP  ____D. Reche Dixon, MD    ____K. Philipp Deputy, MD    ____Other     This letter is to inform you that your recent test(s):  _______Pap Smear    __X_____Lab Test     _______X-ray    ___X____ is within acceptable limits  _______ requires a medication change  _______ requires a follow-up lab visit  _______ requires a follow-up visit with your provider   Comments:       _________________________________________________________ If you have any questions, please contact our office                     Sincerely,  Armenia Shannon [facility]

## 2010-06-07 NOTE — Letter (Signed)
Summary: PAIN CLINIC NOTES FOR BACK PAIN  PAIN CLINIC NOTES FOR BACK PAIN   Imported By: Arta Bruce 08/11/2009 10:22:48  _____________________________________________________________________  External Attachment:    Type:   Image     Comment:   External Document

## 2010-06-07 NOTE — Miscellaneous (Signed)
  Clinical Lists Changes  Medications: Changed medication from COUMADIN 5 MG TABS (WARFARIN SODIUM) twice daily to COUMADIN 10 MG TABS (WARFARIN SODIUM) Take 1 tablet by mouth once a day

## 2010-06-07 NOTE — Progress Notes (Signed)
Summary: no dvt  ---- Converted from flag ---- ---- 08/31/2009 11:41 AM, Missy Al-Rammal, RVT, RDCS wrote: No evidence of acute OR chronic left leg DVT or superficial thrombus. ------------------------------

## 2010-06-12 ENCOUNTER — Emergency Department (HOSPITAL_COMMUNITY): Payer: Self-pay

## 2010-06-12 ENCOUNTER — Emergency Department (HOSPITAL_COMMUNITY)
Admission: EM | Admit: 2010-06-12 | Discharge: 2010-06-12 | Disposition: A | Discharge status: Home / Self Care | Payer: Self-pay | Attending: Emergency Medicine | Admitting: Emergency Medicine

## 2010-06-12 DIAGNOSIS — I4510 Unspecified right bundle-branch block: Secondary | ICD-10-CM | POA: Insufficient documentation

## 2010-06-12 DIAGNOSIS — Z86718 Personal history of other venous thrombosis and embolism: Secondary | ICD-10-CM | POA: Insufficient documentation

## 2010-06-12 DIAGNOSIS — Z86711 Personal history of pulmonary embolism: Secondary | ICD-10-CM | POA: Insufficient documentation

## 2010-06-12 DIAGNOSIS — R0789 Other chest pain: Secondary | ICD-10-CM | POA: Insufficient documentation

## 2010-06-12 LAB — POCT I-STAT, CHEM 8
BUN: 12 mg/dL (ref 6–23)
Creatinine, Ser: 1.1 mg/dL (ref 0.4–1.5)
Hemoglobin: 15 g/dL (ref 13.0–17.0)
Potassium: 3.7 mEq/L (ref 3.5–5.1)
Sodium: 140 mEq/L (ref 135–145)

## 2010-06-12 LAB — PROTIME-INR
INR: 1.06 (ref 0.00–1.49)
Prothrombin Time: 14 seconds (ref 11.6–15.2)

## 2010-06-12 MED ORDER — IOHEXOL 300 MG/ML  SOLN
100.0000 mL | Freq: Once | INTRAMUSCULAR | Status: AC | PRN
  Administered 2010-06-12: 100 mL via INTRAVENOUS

## 2010-07-25 LAB — URINALYSIS, ROUTINE W REFLEX MICROSCOPIC
Glucose, UA: NEGATIVE mg/dL
Protein, ur: NEGATIVE mg/dL
Specific Gravity, Urine: 1.005 (ref 1.005–1.030)
pH: 7 (ref 5.0–8.0)

## 2010-07-25 LAB — CBC
HCT: 46 % (ref 39.0–52.0)
Hemoglobin: 14.9 g/dL (ref 13.0–17.0)
Hemoglobin: 15.5 g/dL (ref 13.0–17.0)
MCHC: 34 g/dL (ref 30.0–36.0)
MCV: 97.7 fL (ref 78.0–100.0)
MCV: 97.8 fL (ref 78.0–100.0)
Platelets: 194 10*3/uL (ref 150–400)
RBC: 4.47 MIL/uL (ref 4.22–5.81)
RBC: 4.52 MIL/uL (ref 4.22–5.81)
RBC: 4.68 MIL/uL (ref 4.22–5.81)
RDW: 13 % (ref 11.5–15.5)
RDW: 13.2 % (ref 11.5–15.5)
WBC: 3.7 10*3/uL — ABNORMAL LOW (ref 4.0–10.5)
WBC: 5.2 10*3/uL (ref 4.0–10.5)

## 2010-07-25 LAB — PROTIME-INR
INR: 1.11 (ref 0.00–1.49)
INR: 1.71 — ABNORMAL HIGH (ref 0.00–1.49)
Prothrombin Time: 14.2 seconds (ref 11.6–15.2)
Prothrombin Time: 19.9 seconds — ABNORMAL HIGH (ref 11.6–15.2)

## 2010-07-25 LAB — BASIC METABOLIC PANEL
BUN: 8 mg/dL (ref 6–23)
CO2: 29 mEq/L (ref 19–32)
Calcium: 9 mg/dL (ref 8.4–10.5)
Chloride: 101 mEq/L (ref 96–112)
Chloride: 102 mEq/L (ref 96–112)
Creatinine, Ser: 0.83 mg/dL (ref 0.4–1.5)
Creatinine, Ser: 0.91 mg/dL (ref 0.4–1.5)
GFR calc Af Amer: >60 mL/min (ref >60)
GFR calc Af Amer: >60 mL/min (ref >60)
GFR calc non Af Amer: >60 mL/min (ref >60)
GFR calc non Af Amer: >60 mL/min (ref >60)
Glucose, Bld: 80 mg/dL (ref 70–99)
Glucose, Bld: 89 mg/dL (ref 70–99)
Glucose, Bld: 94 mg/dL (ref 70–99)
Potassium: 3.4 mEq/L — ABNORMAL LOW (ref 3.5–5.1)
Sodium: 136 mEq/L (ref 135–145)
Sodium: 136 mEq/L (ref 135–145)

## 2010-07-25 LAB — D-DIMER, QUANTITATIVE: D-Dimer, Quant: 1.99 ug/mL-FEU — ABNORMAL HIGH (ref 0.00–0.48)

## 2010-07-25 LAB — DIFFERENTIAL
Basophils Absolute: 0 10*3/uL (ref 0.0–0.1)
Lymphocytes Relative: 39 % (ref 12–46)
Neutro Abs: 3 10*3/uL (ref 1.7–7.7)
Neutrophils Relative %: 49 % (ref 43–77)

## 2010-07-25 LAB — COMPREHENSIVE METABOLIC PANEL
BUN: 5 mg/dL — ABNORMAL LOW (ref 6–23)
CO2: 29 mEq/L (ref 19–32)
Calcium: 8.9 mg/dL (ref 8.4–10.5)
Creatinine, Ser: 0.91 mg/dL (ref 0.4–1.5)
GFR calc non Af Amer: >60 mL/min (ref >60)
Glucose, Bld: 85 mg/dL (ref 70–99)

## 2010-07-25 LAB — LUPUS ANTICOAGULANT PANEL
PTTLA 4:1 Mix: 48.9 secs — ABNORMAL HIGH (ref 36.3–48.8)
PTTLA Confirmation: 0.6 secs (ref <8.0)
dRVVT Incubated 1:1 Mix: 41.9 secs (ref 36.2–44.3)

## 2010-07-25 LAB — PROTEIN S ACTIVITY: Protein S Activity: 19 % — ABNORMAL LOW (ref 69–129)

## 2010-07-25 LAB — CK TOTAL AND CKMB (NOT AT ARMC)
CK, MB: 0.5 ng/mL (ref 0.3–4.0)
Relative Index: 0.3 (ref 0.0–2.5)
Total CK: 148 U/L (ref 7–232)

## 2010-07-25 LAB — HOMOCYSTEINE: Homocysteine: 7.8 umol/L (ref 4.0–15.4)

## 2010-07-25 LAB — CARDIAC PANEL(CRET KIN+CKTOT+MB+TROPI): Troponin I: 0.01 ng/mL (ref 0.00–0.06)

## 2010-07-25 LAB — BETA-2-GLYCOPROTEIN I ABS, IGG/M/A: Beta-2-Glycoprotein I IgA: <3 U/mL (ref <=15)

## 2010-07-25 LAB — PROTEIN C ACTIVITY: Protein C Activity: 121 % (ref 75–133)

## 2010-07-25 LAB — PROTEIN S, TOTAL: Protein S Ag, Total: 52 % — ABNORMAL LOW (ref 70–140)

## 2010-07-25 LAB — FACTOR 5 LEIDEN

## 2010-07-25 LAB — APTT: aPTT: 34 seconds (ref 24–37)

## 2010-07-25 LAB — CARDIOLIPIN ANTIBODIES, IGG, IGM, IGA: Anticardiolipin IgM: 4 MPL U/mL — ABNORMAL LOW (ref <10)

## 2010-07-25 LAB — URINE CULTURE

## 2010-07-25 LAB — TROPONIN I: Troponin I: 0.01 ng/mL (ref 0.00–0.06)

## 2010-07-25 LAB — ANTITHROMBIN III: AntiThromb III Func: 100 % (ref 76–126)

## 2010-10-11 ENCOUNTER — Other Ambulatory Visit: Payer: Self-pay | Admitting: Oncology

## 2010-10-11 ENCOUNTER — Encounter (HOSPITAL_BASED_OUTPATIENT_CLINIC_OR_DEPARTMENT_OTHER): Payer: Self-pay | Admitting: Oncology

## 2010-10-11 DIAGNOSIS — Z86718 Personal history of other venous thrombosis and embolism: Secondary | ICD-10-CM

## 2010-10-11 DIAGNOSIS — Z7901 Long term (current) use of anticoagulants: Secondary | ICD-10-CM

## 2010-10-11 LAB — PROTIME-INR
INR: 1.2 — ABNORMAL LOW (ref 2.00–3.50)
Protime: 14.4 Seconds — ABNORMAL HIGH (ref 10.6–13.4)

## 2010-12-14 ENCOUNTER — Encounter (HOSPITAL_BASED_OUTPATIENT_CLINIC_OR_DEPARTMENT_OTHER): Payer: Self-pay | Admitting: Oncology

## 2010-12-14 ENCOUNTER — Other Ambulatory Visit: Payer: Self-pay | Admitting: Oncology

## 2010-12-14 DIAGNOSIS — Z7901 Long term (current) use of anticoagulants: Secondary | ICD-10-CM

## 2010-12-14 DIAGNOSIS — Z86718 Personal history of other venous thrombosis and embolism: Secondary | ICD-10-CM

## 2010-12-14 DIAGNOSIS — R52 Pain, unspecified: Secondary | ICD-10-CM

## 2010-12-14 DIAGNOSIS — R0602 Shortness of breath: Secondary | ICD-10-CM

## 2010-12-14 LAB — PROTIME-INR
INR: 1.2 — ABNORMAL LOW (ref 2.00–3.50)
Protime: 14.4 Seconds — ABNORMAL HIGH (ref 10.6–13.4)

## 2011-01-04 ENCOUNTER — Other Ambulatory Visit: Payer: Self-pay | Admitting: Oncology

## 2011-01-04 ENCOUNTER — Encounter (HOSPITAL_BASED_OUTPATIENT_CLINIC_OR_DEPARTMENT_OTHER): Payer: Self-pay | Admitting: Oncology

## 2011-01-04 DIAGNOSIS — Z86718 Personal history of other venous thrombosis and embolism: Secondary | ICD-10-CM

## 2011-01-04 DIAGNOSIS — Z7901 Long term (current) use of anticoagulants: Secondary | ICD-10-CM

## 2011-01-29 LAB — INFLUENZA A AND B ANTIGEN (CONVERTED LAB)
Inflenza A Ag: POSITIVE — AB
Influenza B Ag: NEGATIVE

## 2011-01-29 LAB — GC/CHLAMYDIA PROBE AMP, GENITAL: GC Probe Amp, Genital: NEGATIVE

## 2011-02-04 LAB — POCT RAPID STREP A: Streptococcus, Group A Screen (Direct): POSITIVE — AB

## 2011-02-18 ENCOUNTER — Encounter (HOSPITAL_BASED_OUTPATIENT_CLINIC_OR_DEPARTMENT_OTHER): Payer: Self-pay | Admitting: Oncology

## 2011-02-18 ENCOUNTER — Other Ambulatory Visit: Payer: Self-pay | Admitting: Oncology

## 2011-02-18 DIAGNOSIS — R0602 Shortness of breath: Secondary | ICD-10-CM

## 2011-02-18 DIAGNOSIS — Z7901 Long term (current) use of anticoagulants: Secondary | ICD-10-CM

## 2011-02-18 DIAGNOSIS — Z86718 Personal history of other venous thrombosis and embolism: Secondary | ICD-10-CM

## 2011-02-18 DIAGNOSIS — R52 Pain, unspecified: Secondary | ICD-10-CM

## 2011-02-18 LAB — PROTIME-INR: Protime: 13.2 Seconds (ref 10.6–13.4)

## 2011-03-14 ENCOUNTER — Telehealth: Payer: Self-pay | Admitting: Pharmacist

## 2011-03-14 NOTE — Telephone Encounter (Signed)
Called pt and left message asking him to reschedule his lab/coumadin clinic appointment from 03/07/11.

## 2011-04-02 ENCOUNTER — Telehealth: Payer: Self-pay | Admitting: Pharmacist

## 2011-04-03 ENCOUNTER — Encounter: Payer: Self-pay | Admitting: *Deleted

## 2011-04-03 ENCOUNTER — Emergency Department (HOSPITAL_COMMUNITY): Payer: Self-pay

## 2011-04-03 ENCOUNTER — Emergency Department (HOSPITAL_COMMUNITY)
Admission: EM | Admit: 2011-04-03 | Discharge: 2011-04-03 | Disposition: A | Discharge status: Home / Self Care | Payer: Self-pay | Attending: Emergency Medicine | Admitting: Emergency Medicine

## 2011-04-03 DIAGNOSIS — J069 Acute upper respiratory infection, unspecified: Secondary | ICD-10-CM | POA: Insufficient documentation

## 2011-04-03 DIAGNOSIS — R059 Cough, unspecified: Secondary | ICD-10-CM | POA: Insufficient documentation

## 2011-04-03 DIAGNOSIS — R0602 Shortness of breath: Secondary | ICD-10-CM | POA: Insufficient documentation

## 2011-04-03 DIAGNOSIS — R05 Cough: Secondary | ICD-10-CM | POA: Insufficient documentation

## 2011-04-03 DIAGNOSIS — Z7901 Long term (current) use of anticoagulants: Secondary | ICD-10-CM | POA: Insufficient documentation

## 2011-04-03 DIAGNOSIS — F17200 Nicotine dependence, unspecified, uncomplicated: Secondary | ICD-10-CM | POA: Insufficient documentation

## 2011-04-03 DIAGNOSIS — Z86718 Personal history of other venous thrombosis and embolism: Secondary | ICD-10-CM | POA: Insufficient documentation

## 2011-04-03 HISTORY — DX: Other pulmonary embolism without acute cor pulmonale: I26.99

## 2011-04-03 HISTORY — DX: Migraine, unspecified, not intractable, without status migrainosus: G43.909

## 2011-04-03 LAB — APTT: aPTT: 38 seconds — ABNORMAL HIGH (ref 24–37)

## 2011-04-03 LAB — BASIC METABOLIC PANEL
BUN: 9 mg/dL (ref 6–23)
CO2: 26 mEq/L (ref 19–32)
Calcium: 9.3 mg/dL (ref 8.4–10.5)
Chloride: 104 mEq/L (ref 96–112)
Creatinine, Ser: 0.9 mg/dL (ref 0.50–1.35)
GFR calc Af Amer: >90 mL/min (ref >90)
GFR calc non Af Amer: >90 mL/min (ref >90)
Glucose, Bld: 89 mg/dL (ref 70–99)
Potassium: 3.6 mEq/L (ref 3.5–5.1)
Sodium: 140 mEq/L (ref 135–145)

## 2011-04-03 LAB — DIFFERENTIAL
Basophils Absolute: 0 10*3/uL (ref 0.0–0.1)
Basophils Relative: 0 % (ref 0–1)
Eosinophils Absolute: 0.1 10*3/uL (ref 0.0–0.7)
Eosinophils Relative: 1 % (ref 0–5)
Lymphocytes Relative: 27 % (ref 12–46)
Lymphs Abs: 1.7 10*3/uL (ref 0.7–4.0)
Monocytes Absolute: 0.9 10*3/uL (ref 0.1–1.0)
Monocytes Relative: 14 % — ABNORMAL HIGH (ref 3–12)
Neutro Abs: 3.7 10*3/uL (ref 1.7–7.7)
Neutrophils Relative %: 58 % (ref 43–77)

## 2011-04-03 LAB — CBC
HCT: 42.9 % (ref 39.0–52.0)
Hemoglobin: 15.2 g/dL (ref 13.0–17.0)
MCH: 33.7 pg (ref 26.0–34.0)
MCHC: 35.4 g/dL (ref 30.0–36.0)
MCV: 95.1 fL (ref 78.0–100.0)
Platelets: 164 10*3/uL (ref 150–400)
RBC: 4.51 MIL/uL (ref 4.22–5.81)
RDW: 13 % (ref 11.5–15.5)
WBC: 6.4 10*3/uL (ref 4.0–10.5)

## 2011-04-03 LAB — PROTIME-INR
INR: 1.94 — ABNORMAL HIGH (ref 0.00–1.49)
Prothrombin Time: 22.5 seconds — ABNORMAL HIGH (ref 11.6–15.2)

## 2011-04-03 MED ORDER — PROMETHAZINE-DM 6.25-15 MG/5ML PO SYRP: 5.0000 mL | ORAL_SOLUTION | Freq: Four times a day (QID) | ORAL | Status: AC | PRN

## 2011-04-03 MED ORDER — SODIUM CHLORIDE 0.9 % IV BOLUS (SEPSIS)
2000.0000 mL | Freq: Once | INTRAVENOUS | Status: AC
  Administered 2011-04-03: 1000 mL via INTRAVENOUS

## 2011-04-03 MED ORDER — GUAIFENESIN ER 1200 MG PO TB12: 1.0000 | ORAL_TABLET | Freq: Two times a day (BID) | ORAL | Status: DC

## 2011-04-03 MED ORDER — METOCLOPRAMIDE HCL 5 MG/ML IJ SOLN
10.0000 mg | Freq: Once | INTRAMUSCULAR | Status: AC
  Administered 2011-04-03: 10 mg via INTRAVENOUS
  Filled 2011-04-03: qty 2

## 2011-04-03 MED ORDER — KETOROLAC TROMETHAMINE 30 MG/ML IJ SOLN
30.0000 mg | Freq: Once | INTRAMUSCULAR | Status: AC
  Administered 2011-04-03: 30 mg via INTRAVENOUS
  Filled 2011-04-03: qty 1

## 2011-04-03 NOTE — ED Notes (Signed)
Pt states "I feel like I caught the flu x 3 wks ago and I feel like it's back"; pt c/o nasal & chest congestion, migraine x 2 days

## 2011-04-03 NOTE — ED Provider Notes (Signed)
History     CSN: [id number] Arrival date & time: 04/03/2011  8:06 AM   First MD Initiated Contact with Patient 04/03/11 [phone]      Chief Complaint  Patient presents with  . URI    (Consider location/radiation/quality/duration/timing/severity/associated sxs/prior treatment) Patient is a 25 y.o. male presenting with URI.  URI The primary symptoms include cough. Primary symptoms do not include fever, fatigue, headaches, ear pain, sore throat, swollen glands, wheezing, abdominal pain, nausea, vomiting, myalgias or rash.  Symptoms associated with the illness include congestion and rhinorrhea. The illness is not associated with chills.   patient presents to the emergency department today with a two-day history of upper respiratory symptoms.  These include, cough, nasal congestion, and runny nose.  Patient states, that he has not had any vomiting, nausea, abdominal pain, chest pain, shortness of breath, headache, fevers, dysuria, or ear pain.   Past Medical History  Diagnosis Date  . Migraines   . Pulmonary embolus     Past Surgical History  Procedure Date  . Umbilical hernia repair     No family history on file.  History  Substance Use Topics  . Smoking status: Current Everyday Smoker -- 0.5 packs/day  . Smokeless tobacco: Not on file  . Alcohol Use: Yes     occassionally      Review of Systems  Constitutional: Negative for fever, chills and fatigue.  HENT: Positive for congestion and rhinorrhea. Negative for ear pain and sore throat.   Respiratory: Positive for cough. Negative for wheezing.   Gastrointestinal: Negative for nausea, vomiting and abdominal pain.  Musculoskeletal: Negative for myalgias.  Skin: Negative for rash.  Neurological: Negative for headaches.   All pertinent positives and negatives in the history of present illness Allergies  Review of patient's allergies indicates no known allergies.  Home Medications   Current Outpatient Rx  Name Route Sig  Dispense Refill  . ACETAMINOPHEN 500 MG PO TABS Oral Take 1,000 mg by mouth every 6 (six) hours as needed. For pain/fever     . VITAMIN C PO Oral Take 2 tablets by mouth once.      Marland Kitchen VITAMIN B-12 PO Oral Take 4 tablets by mouth once.      Marland Kitchen PHENYLEPHRINE-DM-GG-APAP 5-10-200-325 MG/10ML PO LIQD Oral Take 5 mLs by mouth once.      . WARFARIN SODIUM 5 MG PO TABS Oral Take 15 mg by mouth daily.       BP 119/60  Pulse 85  Temp(Src) 98.7 F (37.1 C) (Oral)  Resp 18  Ht 6' (1.829 m)  Wt 176 lb (79.833 kg)  BMI 23.87 kg/m2  SpO2 99%  Physical Exam  Constitutional: He is oriented to person, place, and time. He appears well-developed and well-nourished. No distress.  HENT:  Head: Normocephalic and atraumatic.  Eyes: Conjunctivae are normal. Pupils are equal, round, and reactive to light.  Neck: Normal range of motion. Neck supple.  Cardiovascular: Normal rate, regular rhythm and normal heart sounds.   Pulmonary/Chest: Effort normal and breath sounds normal. No respiratory distress. He has no wheezes. He has no rales.  Abdominal: Soft. Bowel sounds are normal. He exhibits no distension. There is no tenderness. There is no rebound and no guarding.  Neurological: He is alert and oriented to person, place, and time.  Skin: Skin is warm and dry. No rash noted. No erythema. No pallor.    ED Course  Procedures (including critical care time)  Labs Reviewed  PROTIME-INR - Abnormal; Notable for  the following:    Prothrombin Time 22.5 (*)    INR 1.94 (*)    All other components within normal limits  APTT - Abnormal; Notable for the following:    aPTT 38 (*)    All other components within normal limits  DIFFERENTIAL - Abnormal; Notable for the following:    Monocytes Relative 14 (*)    All other components within normal limits  CBC  BASIC METABOLIC PANEL   Dg Chest 2 View  04/03/2011  *RADIOLOGY REPORT*  Clinical Data: Cough and short of breath  CHEST - 2 VIEW  Comparison: 06/12/2010   Findings: The heart is normal in size.  Lungs are hyperaerated and clear.  No pneumothorax and no pleural effusion.  IMPRESSION: Hyperaeration but no active cardiopulmonary disease.  Original Report Authenticated By: Donavan Burnet, M.D.    Patient has normal lab testing here.  He is advised to return if any worsening in his symptoms.  Based on his history of present illness and physical exam it appears that the patient has a viral URI.      MDM  Viral upper respiratory illness.  Based on history of present illness and physical exam.  Told to return here as needed, for any worsening his condition.  The patient is advised to increase his fluid.         Jamesetta Orleans [place], [place] 04/03/11 1428

## 2011-04-05 NOTE — ED Provider Notes (Signed)
Medical screening examination/treatment/procedure(s) were performed by non-physician practitioner and as supervising physician I was immediately available for consultation/collaboration.   Shadee Montoya A. [place] Delauder, MD 04/05/11 1244 

## 2011-04-10 ENCOUNTER — Telehealth: Payer: Self-pay | Admitting: Pharmacist

## 2011-04-11 ENCOUNTER — Other Ambulatory Visit: Payer: Self-pay

## 2011-04-11 ENCOUNTER — Ambulatory Visit: Payer: Self-pay

## 2011-04-12 ENCOUNTER — Other Ambulatory Visit: Payer: Self-pay | Admitting: Pharmacist

## 2011-04-12 DIAGNOSIS — D6859 Other primary thrombophilia: Secondary | ICD-10-CM

## 2011-04-12 DIAGNOSIS — I2699 Other pulmonary embolism without acute cor pulmonale: Secondary | ICD-10-CM | POA: Insufficient documentation

## 2011-04-15 ENCOUNTER — Telehealth: Payer: Self-pay | Admitting: *Deleted

## 2011-04-15 NOTE — Telephone Encounter (Signed)
per message from the pharmacy made patient appointment for 04-16-2011 at 10:00am coumdin clinic 10:15am

## 2011-04-16 ENCOUNTER — Other Ambulatory Visit: Payer: Self-pay | Admitting: Lab

## 2011-04-16 ENCOUNTER — Telehealth: Payer: Self-pay | Admitting: Pharmacist

## 2011-04-16 ENCOUNTER — Ambulatory Visit: Payer: Self-pay

## 2011-04-26 ENCOUNTER — Telehealth: Payer: Self-pay | Admitting: Pharmacist

## 2011-05-28 ENCOUNTER — Telehealth: Payer: Self-pay | Admitting: Pharmacist

## 2011-06-07 ENCOUNTER — Telehealth: Payer: Self-pay | Admitting: Pharmacist

## 2011-06-11 ENCOUNTER — Ambulatory Visit: Payer: Self-pay

## 2011-06-11 ENCOUNTER — Other Ambulatory Visit (HOSPITAL_BASED_OUTPATIENT_CLINIC_OR_DEPARTMENT_OTHER): Payer: Self-pay | Admitting: Lab

## 2011-06-11 DIAGNOSIS — Z5181 Encounter for therapeutic drug level monitoring: Secondary | ICD-10-CM

## 2011-06-11 DIAGNOSIS — I82890 Acute embolism and thrombosis of other specified veins: Secondary | ICD-10-CM

## 2011-06-11 DIAGNOSIS — I2699 Other pulmonary embolism without acute cor pulmonale: Secondary | ICD-10-CM

## 2011-06-11 DIAGNOSIS — D6859 Other primary thrombophilia: Secondary | ICD-10-CM

## 2011-06-11 DIAGNOSIS — Z7901 Long term (current) use of anticoagulants: Secondary | ICD-10-CM

## 2011-06-11 LAB — POCT INR: INR: 1.1

## 2011-06-11 NOTE — Patient Instructions (Signed)
Take 20mg  (4 tablets) today and tomorrow (06/12/11). Then on 06/13/11, continue taking 15mg  daily. Recheck INR in 2 weeks.

## 2011-06-11 NOTE — Progress Notes (Unsigned)
Take 20mg  today and tomorrow (06/12/11). 20mg  = 4 tablets.  Then on 06/13/11, continue taking 15mg  daily. Recheck INR in 2 weeks.  Pt cannot return to clinic before that date.

## 2011-06-24 ENCOUNTER — Ambulatory Visit: Payer: Self-pay

## 2011-06-24 ENCOUNTER — Other Ambulatory Visit: Payer: Self-pay

## 2011-06-24 ENCOUNTER — Telehealth: Payer: Self-pay | Admitting: Pharmacist

## 2011-07-16 ENCOUNTER — Other Ambulatory Visit: Payer: Self-pay | Admitting: Pharmacist

## 2011-07-16 DIAGNOSIS — I2699 Other pulmonary embolism without acute cor pulmonale: Secondary | ICD-10-CM

## 2011-07-18 ENCOUNTER — Other Ambulatory Visit: Payer: Self-pay

## 2011-07-18 ENCOUNTER — Ambulatory Visit (HOSPITAL_BASED_OUTPATIENT_CLINIC_OR_DEPARTMENT_OTHER): Payer: Self-pay | Admitting: Pharmacist

## 2011-07-18 DIAGNOSIS — D6859 Other primary thrombophilia: Secondary | ICD-10-CM

## 2011-07-18 DIAGNOSIS — I2699 Other pulmonary embolism without acute cor pulmonale: Secondary | ICD-10-CM

## 2011-07-18 LAB — PROTIME-INR

## 2011-07-18 LAB — PROTHROMBIN TIME: Prothrombin Time: 41.1 seconds — ABNORMAL HIGH (ref 11.6–15.2)

## 2011-07-18 NOTE — Patient Instructions (Signed)
Hold Coumadin today (07/18/11). Resume Coumadin 15mg  (3 tablets) daily on 07/19/11. Recheck INR in 1 week on 07/25/11 @ 3:15pm.

## 2011-07-18 NOTE — Progress Notes (Signed)
  No problems today. No bleeding or bruising noted. Pt stated he may have taken extra doses on 2-3 occasions (days) in the last week. If he couldn't remember if he took his dose or not, he took it again. He also may have missed a dose or two in the last week. I gave patient a Coumadin diary card for March. He was instructed to fill in his dose of Coumadin and put a check mark next to the dose when he took it. He could carry it with him (in his wallet) along with his phone since he uses his phone alarms to help him remember to take his Coumadin dose. We want to avoid taking 2 doses of Coumadin in the same day. Pt also stated he has had recent alcohol consumption in the last week.  On 3/11 and 3/13, he had 2-3 (12oz beers) and ~3 shots of vodka each day. This is not unusual for him. INR likely elevated secondary to possible extra doses of Coumadin and recent alcohol intake.  Hold Coumadin today (07/18/11). Resume Coumadin 15mg  daily on 07/19/11. Recheck INR in 1 week on 07/25/11 @ 3:15pm.

## 2011-07-19 ENCOUNTER — Ambulatory Visit: Payer: Self-pay

## 2011-07-19 ENCOUNTER — Telehealth: Payer: Self-pay | Admitting: *Deleted

## 2011-07-19 NOTE — Telephone Encounter (Signed)
per staff message made patient appointment for 07-25-2011 starting at 3:15pm

## 2011-07-25 ENCOUNTER — Ambulatory Visit: Payer: Self-pay

## 2011-07-25 ENCOUNTER — Other Ambulatory Visit: Payer: Self-pay

## 2011-08-02 ENCOUNTER — Telehealth: Payer: Self-pay | Admitting: Pharmacist

## 2011-08-19 ENCOUNTER — Telehealth: Payer: Self-pay | Admitting: Pharmacist

## 2011-08-30 ENCOUNTER — Other Ambulatory Visit: Payer: Self-pay | Admitting: Pharmacist

## 2011-08-30 DIAGNOSIS — I2699 Other pulmonary embolism without acute cor pulmonale: Secondary | ICD-10-CM

## 2011-08-30 MED ORDER — WARFARIN SODIUM 5 MG PO TABS: 15.0000 mg | ORAL_TABLET | Freq: Every day | ORAL | Status: DC

## 2011-09-02 ENCOUNTER — Telehealth: Payer: Self-pay | Admitting: *Deleted

## 2011-09-02 ENCOUNTER — Ambulatory Visit: Payer: Self-pay

## 2011-09-02 NOTE — Telephone Encounter (Signed)
per staff message placed patient on lab and coumadin clinic schedules

## 2011-09-03 ENCOUNTER — Other Ambulatory Visit: Payer: Self-pay | Admitting: Lab

## 2011-09-03 ENCOUNTER — Ambulatory Visit: Payer: Self-pay

## 2011-09-10 ENCOUNTER — Ambulatory Visit: Payer: Self-pay

## 2011-09-10 ENCOUNTER — Other Ambulatory Visit: Payer: Self-pay

## 2011-10-03 ENCOUNTER — Telehealth: Payer: Self-pay | Admitting: Pharmacist

## 2011-10-03 NOTE — Telephone Encounter (Signed)
Called and left vm for patient to schedule a PT/INR and Coumadin Clinic appt.  He is overdue.  His last PT/INR was 07/18/11.  Instructed him to return our call ASAP to make an appt.

## 2011-10-18 ENCOUNTER — Telehealth: Payer: Self-pay | Admitting: Pharmacist

## 2011-11-01 ENCOUNTER — Telehealth: Payer: Self-pay | Admitting: Pharmacist

## 2011-11-01 NOTE — Telephone Encounter (Signed)
left vm for pt to sched CC appt.  Last seen 07/18/11. hh

## 2011-11-22 ENCOUNTER — Telehealth: Payer: Self-pay | Admitting: Pharmacist

## 2012-02-20 ENCOUNTER — Encounter: Payer: Self-pay | Admitting: Pharmacist

## 2012-02-20 NOTE — Progress Notes (Signed)
Letter has been sent to patient informing him that he has been discharged from the Coumadin Clinic.   Pt has been noncompliant with multiple visits despite repeated phone calls, voicemails, and reminders. If he is ready to return to clinic and comply with visits, he is free to call [phone] and schedule a follow up appt with Dr. Donnie Coffin.   He has been archived in [facility].

## 2012-08-13 ENCOUNTER — Telehealth: Payer: Self-pay | Admitting: Pharmacist

## 2012-08-13 ENCOUNTER — Encounter: Payer: Self-pay | Admitting: Pharmacist

## 2012-08-13 DIAGNOSIS — I2699 Other pulmonary embolism without acute cor pulmonale: Secondary | ICD-10-CM

## 2012-08-13 MED ORDER — WARFARIN SODIUM 5 MG PO TABS: 15.0000 mg | ORAL_TABLET | Freq: Every day | ORAL | Status: DC

## 2012-08-13 NOTE — Progress Notes (Signed)
Pt will come 08/26/12 at 10 am for lab; 10:15 am for Coumadin clinic & 11 am for new pt visit w/ Dr. Gaylyn Rong (reassignment from Dr. Donnie Coffin). Per Trey Paula Heffelfinger's discussion w/ pt in lobby today, pt has one chance to make this appt & if he does not come, then we can no longer care for pt here. Ebony Hail, Pharm.D., CPP 08/13/2012@3 :11 PM

## 2012-08-13 NOTE — Telephone Encounter (Signed)
Pt walked in unscheduled today stating he was out of Coumadin & needed more. We haven't seen pt since March 2013.  We had discharged pt from our Coumadin clinic due to non-compliance. I s/w pt in the waiting area.  He has not taken Coumadin in >1 month.  No need for INR check today. I authorized 3 weeks supply of Coumadin 15 mg/day & pt will return 4/21 for INR check. Ebony Hail, Pharm.D., CPP 08/13/2012@2 :44 PM

## 2012-08-24 NOTE — Patient Instructions (Addendum)
1.  Diagnosis:  Deep vein thrombosis and pulmonary embolism. 2.  Recommendation:  Life long blood thinner. 3.  Pros and cons of Xarelto versus Coumadin:     Xarelto Coumadin  Efficacy in decreasing recurrent clot Equal (if anything, slightly better) Equal  Antidote None Yes (Vitamin K; fresh frozen plasma)  Monitoring None Yes, frequent INR monitoring  Cost Expensive Much cheaper  Bleeding risk in general Slightly less than Coumadin (but slightly higher risk of GI bleeding).

## 2012-08-26 ENCOUNTER — Ambulatory Visit (HOSPITAL_BASED_OUTPATIENT_CLINIC_OR_DEPARTMENT_OTHER): Payer: Self-pay | Admitting: Oncology

## 2012-08-26 ENCOUNTER — Other Ambulatory Visit (HOSPITAL_BASED_OUTPATIENT_CLINIC_OR_DEPARTMENT_OTHER): Payer: Self-pay | Admitting: Lab

## 2012-08-26 ENCOUNTER — Ambulatory Visit: Payer: Self-pay

## 2012-08-26 ENCOUNTER — Telehealth: Payer: Self-pay | Admitting: Oncology

## 2012-08-26 ENCOUNTER — Encounter: Payer: Self-pay | Admitting: Oncology

## 2012-08-26 ENCOUNTER — Ambulatory Visit (HOSPITAL_BASED_OUTPATIENT_CLINIC_OR_DEPARTMENT_OTHER): Payer: Self-pay | Admitting: Pharmacist

## 2012-08-26 VITALS — BP 107/68 | HR 78 | Temp 98.2°F | Resp 18 | Ht 72.0 in | Wt 178.6 lb

## 2012-08-26 DIAGNOSIS — D6859 Other primary thrombophilia: Secondary | ICD-10-CM

## 2012-08-26 DIAGNOSIS — I82409 Acute embolism and thrombosis of unspecified deep veins of unspecified lower extremity: Secondary | ICD-10-CM

## 2012-08-26 DIAGNOSIS — I2699 Other pulmonary embolism without acute cor pulmonale: Secondary | ICD-10-CM

## 2012-08-26 LAB — POCT INR: INR: 1.2

## 2012-08-26 LAB — CBC WITH DIFFERENTIAL/PLATELET
Eosinophils Absolute: 0.1 10*3/uL (ref 0.0–0.5)
LYMPH%: 46.2 % (ref 14.0–49.0)
MCHC: 34.7 g/dL (ref 32.0–36.0)
MCV: 96.1 fL (ref 79.3–98.0)
MONO%: 11.7 % (ref 0.0–14.0)
NEUT#: 1.6 10*3/uL (ref 1.5–6.5)
NEUT%: 40.3 % (ref 39.0–75.0)
Platelets: 182 10*3/uL (ref 140–400)
RBC: 4.89 10*6/uL (ref 4.20–5.82)

## 2012-08-26 LAB — PROTIME-INR: Protime: 14.4 Seconds — ABNORMAL HIGH (ref 10.6–13.4)

## 2012-08-26 LAB — COMPREHENSIVE METABOLIC PANEL (CC13)
Albumin: 3.9 g/dL (ref 3.5–5.0)
Alkaline Phosphatase: 45 U/L (ref 40–150)
BUN: 10 mg/dL (ref 7.0–26.0)
CO2: 27 mEq/L (ref 22–29)
Calcium: 9.3 mg/dL (ref 8.4–10.4)
Glucose: 83 mg/dl (ref 70–99)
Potassium: 3.9 mEq/L (ref 3.5–5.1)

## 2012-08-26 NOTE — Progress Notes (Signed)
I gave the patient an EPP. He has no insurance. He said he tried to get thru to [facility] and could not get thru. I told him to try again. He said was not able to be medicaid. He will call me if he can get signed up for insurance. I advised him this is self pay as of now till he gets insurance. I verified address and ph#.
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
## 2012-08-26 NOTE — Telephone Encounter (Signed)
gv and printed appt sched and avs for pt  °

## 2012-08-26 NOTE — Progress Notes (Signed)
INR = 1.2 Pt was prescribed Warfarin 15 mg daily on 08/13/12 after a long period of non-compliance.  We had not seen pt in our clinic since 07/18/11. I prescribed him 63 tabs, of which he only took 16 tabs or approx 5 doses.  Pt had his bottle of Warfarin tabs today so I did a pill count.  He should've had 39 pills missing.  Clearly he continues to be non-compliant. Pt is asymptomatic for VTE at present. No dose change; continue 15 mg daily.  I encouraged him to take his doses every day.  He did take today's dose while in the clinic. He still has enough Warfarin tabs to get him through until his next appt.  We will need to refill his RX at next visit. Pt is seeing Dr. Gaylyn Rong today. Return 09/07/12. Ebony Hail, Pharm.D., CPP 08/26/2012@11 :01 AM

## 2012-09-01 ENCOUNTER — Encounter: Payer: Self-pay | Admitting: Pharmacist

## 2012-09-01 NOTE — Progress Notes (Signed)
Pt is not eligible for [facility] as he is uninsured.  Cash cost is $280/mo. [employer] to fax a DC order.

## 2012-09-07 ENCOUNTER — Telehealth: Payer: Self-pay | Admitting: Pharmacist

## 2012-09-07 ENCOUNTER — Encounter: Payer: Self-pay | Admitting: Oncology

## 2012-09-07 ENCOUNTER — Other Ambulatory Visit: Payer: Self-pay | Admitting: Lab

## 2012-09-07 ENCOUNTER — Ambulatory Visit: Payer: Self-pay

## 2012-09-07 NOTE — Telephone Encounter (Signed)
Pt FTKA for lab/Coumadin clinic. Dr. Gaylyn Rong alerted & he'd like to give pt one more chance. I called pt on phone & his voicemail states he got a new phone #.  The new # is [phone].  When I called that number, there is a message stating the mailbox has not yet been set up. Jule Ser also aware of today's missed appt.  He would like Dr. Gaylyn Rong to send pt a letter stating that if he continues to miss appts then he will be discharged from our practice.  Dr. Gaylyn Rong made aware & will send pt a letter. Ebony Hail, Pharm.D., CPP 09/07/2012@11 :18 AM

## 2012-10-19 ENCOUNTER — Other Ambulatory Visit: Payer: Self-pay | Admitting: *Deleted

## 2012-10-19 DIAGNOSIS — I2699 Other pulmonary embolism without acute cor pulmonale: Secondary | ICD-10-CM

## 2012-10-19 DIAGNOSIS — D6859 Other primary thrombophilia: Secondary | ICD-10-CM

## 2012-10-19 MED ORDER — WARFARIN SODIUM 5 MG PO TABS: 15.0000 mg | ORAL_TABLET | Freq: Every day | ORAL | Status: DC

## 2012-10-22 ENCOUNTER — Other Ambulatory Visit: Payer: Self-pay | Admitting: Pharmacist

## 2012-10-22 DIAGNOSIS — I2699 Other pulmonary embolism without acute cor pulmonale: Secondary | ICD-10-CM

## 2012-10-22 MED ORDER — WARFARIN SODIUM 5 MG PO TABS: 15.0000 mg | ORAL_TABLET | Freq: Every day | ORAL | Status: DC

## 2012-11-05 ENCOUNTER — Encounter: Payer: Self-pay | Admitting: Pharmacist

## 2012-11-05 ENCOUNTER — Encounter: Payer: Self-pay | Admitting: Oncology

## 2012-11-05 NOTE — Progress Notes (Signed)
Certified letter written today by Dr. Gaylyn Rong re: missed lab/CC appt on 09/07/12. Requested pt call our office to reschedule.  If he fails to do so, he will be discharged from our practice. Letter will be provided to Jule Ser on 11/09/12 for him to review & mail. Ebony Hail, Pharm.D., CPP 11/05/2012@10 :38 AM

## 2012-11-25 ENCOUNTER — Other Ambulatory Visit: Payer: Self-pay | Admitting: Lab

## 2012-11-25 ENCOUNTER — Ambulatory Visit: Payer: Self-pay | Admitting: Oncology

## 2012-12-02 ENCOUNTER — Ambulatory Visit: Payer: Self-pay

## 2012-12-02 ENCOUNTER — Ambulatory Visit: Payer: Self-pay | Admitting: Lab

## 2013-07-07 ENCOUNTER — Encounter (HOSPITAL_COMMUNITY): Payer: Self-pay | Admitting: Emergency Medicine

## 2013-07-07 ENCOUNTER — Other Ambulatory Visit (HOSPITAL_COMMUNITY)
Admission: RE | Admit: 2013-07-07 | Discharge: 2013-07-07 | Disposition: A | Discharge status: Home / Self Care | Payer: Self-pay | Source: Ambulatory Visit | Attending: Family Medicine | Admitting: Family Medicine

## 2013-07-07 ENCOUNTER — Emergency Department (INDEPENDENT_AMBULATORY_CARE_PROVIDER_SITE_OTHER)
Admission: EM | Admit: 2013-07-07 | Discharge: 2013-07-07 | Disposition: A | Discharge status: Home / Self Care | Payer: Self-pay | Source: Home / Self Care | Attending: Family Medicine | Admitting: Family Medicine

## 2013-07-07 DIAGNOSIS — D6859 Other primary thrombophilia: Secondary | ICD-10-CM

## 2013-07-07 DIAGNOSIS — B354 Tinea corporis: Secondary | ICD-10-CM

## 2013-07-07 DIAGNOSIS — M65839 Other synovitis and tenosynovitis, unspecified forearm: Secondary | ICD-10-CM

## 2013-07-07 DIAGNOSIS — M778 Other enthesopathies, not elsewhere classified: Secondary | ICD-10-CM

## 2013-07-07 DIAGNOSIS — Z113 Encounter for screening for infections with a predominantly sexual mode of transmission: Secondary | ICD-10-CM | POA: Insufficient documentation

## 2013-07-07 DIAGNOSIS — N342 Other urethritis: Secondary | ICD-10-CM

## 2013-07-07 DIAGNOSIS — M65849 Other synovitis and tenosynovitis, unspecified hand: Secondary | ICD-10-CM

## 2013-07-07 MED ORDER — PREDNISONE 10 MG PO KIT: PACK | ORAL | Status: DC

## 2013-07-07 MED ORDER — TERBINAFINE HCL 250 MG PO TABS: 250.0000 mg | ORAL_TABLET | Freq: Every day | ORAL | Status: DC

## 2013-07-07 MED ORDER — PERMETHRIN 5 % EX CREA: TOPICAL_CREAM | CUTANEOUS | Status: DC

## 2013-07-07 NOTE — ED Notes (Signed)
Left wrist pain for 3 weeks.  Patient feels pain is related to repetitive motion.   Reports "uncomfortable' urination 1 1/2 years per patient , associated with penile discharge. Patient also has a splotchy rash to left forearm and gestures rash also in groin area and left thigh.

## 2013-07-07 NOTE — Discharge Instructions (Signed)
Thank you for coming in today. Use a wrist brace as needed Take prednisone daily for 12 day for wrist pain. Take terbinafine daily for 7 days for rash I will call you about the results of the urine test  It is very important that you reestablish care for blood clotting.  Please see Rudell CobbDeborah Hill to qualify for reduced or free medical services within the [facility].  Call her at [phone] today. [facility] [address] [place], [place] [address] [phone]  Call or go to the emergency room if you get worse, have trouble breathing, have chest pains, or palpitations.    Apply the permethrin cream from the neck down at night and wash off in the morning. Use Benadryl at night as needed for itching. Use Gold Bond Itch as needed. Come back if not getting better or worsening.   Scabies Scabies are small bugs (mites) that burrow under the skin and cause red bumps and severe itching. These bugs can only be seen with a microscope. Scabies are highly contagious. They can spread easily from person to person by direct contact. They are also spread through sharing clothing or linens that have the scabies mites living in them. It is not unusual for an entire family to become infected through shared towels, clothing, or bedding.  HOME CARE INSTRUCTIONS   Your caregiver may prescribe a cream or lotion to kill the mites. If cream is prescribed, massage the cream into the entire body from the neck to the bottom of both feet. Also massage the cream into the scalp and face if your child is less than 28 year old. Avoid the eyes and mouth. Do not wash your hands after application.  Leave the cream on for 8 to 12 hours. Your child should bathe or shower after the 8 to 12 hour application period. Sometimes it is helpful to apply the cream to your child right before bedtime.  One treatment is usually effective and will eliminate approximately 95% of infestations.  For severe cases, your caregiver may decide to repeat the treatment in 1 week. Everyone in your household should be treated with one application of the cream.  New rashes or burrows should not appear within 24 to 48 hours after successful treatment. However, the itching and rash may last for 2 to 4 weeks after successful treatment. Your caregiver may prescribe a medicine to help with the itching or to help the rash go away more quickly.  Scabies can live on clothing or linens for up to 3 days. All of your child's recently used clothing, towels, stuffed toys, and bed linens should be washed in hot water and then dried in a dryer for at least 20 minutes on high heat. Items that cannot be washed should be enclosed in a plastic bag for at least 3 days.  To help relieve itching, bathe your child in a cool bath or apply cool washcloths to the affected areas.  Your child may return to school after treatment with the prescribed cream. SEEK MEDICAL CARE IF:   The itching persists longer than 4 weeks after treatment.  The rash spreads or becomes infected. Signs of infection include red blisters or yellow-tan crust. Document Released: 04/22/2005 Document Revised: 07/15/2011 Document Reviewed: 08/31/2008 [facility] Patient Information 2014 [place], [place].
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
## 2013-07-09 LAB — URINE CYTOLOGY ANCILLARY ONLY
Chlamydia: NEGATIVE
NEISSERIA GONORRHEA: NEGATIVE
TRICH (WINDOWPATH): NEGATIVE

## 2013-10-21 ENCOUNTER — Encounter (HOSPITAL_COMMUNITY): Payer: Self-pay | Admitting: Emergency Medicine

## 2013-10-21 ENCOUNTER — Emergency Department (INDEPENDENT_AMBULATORY_CARE_PROVIDER_SITE_OTHER)
Admission: EM | Admit: 2013-10-21 | Discharge: 2013-10-21 | Disposition: A | Discharge status: Home / Self Care | Payer: Self-pay | Source: Home / Self Care | Attending: Family Medicine | Admitting: Family Medicine

## 2013-10-21 ENCOUNTER — Other Ambulatory Visit (HOSPITAL_COMMUNITY)
Admission: RE | Admit: 2013-10-21 | Discharge: 2013-10-21 | Disposition: A | Discharge status: Home / Self Care | Payer: Self-pay | Source: Ambulatory Visit | Attending: Family Medicine | Admitting: Family Medicine

## 2013-10-21 DIAGNOSIS — R21 Rash and other nonspecific skin eruption: Secondary | ICD-10-CM

## 2013-10-21 DIAGNOSIS — Z113 Encounter for screening for infections with a predominantly sexual mode of transmission: Secondary | ICD-10-CM | POA: Insufficient documentation

## 2013-10-21 DIAGNOSIS — N342 Other urethritis: Secondary | ICD-10-CM

## 2013-10-21 MED ORDER — TRIAMCINOLONE ACETONIDE 0.5 % EX OINT: 1.0000 "application " | TOPICAL_OINTMENT | Freq: Two times a day (BID) | CUTANEOUS | Status: DC

## 2013-10-21 MED ORDER — METRONIDAZOLE 500 MG PO TABS: 500.0000 mg | ORAL_TABLET | Freq: Two times a day (BID) | ORAL | Status: DC

## 2013-10-21 MED ORDER — TERBINAFINE HCL 250 MG PO TABS: 250.0000 mg | ORAL_TABLET | Freq: Every day | ORAL | Status: DC

## 2013-10-21 NOTE — ED Notes (Signed)
Pt reports penile d/c onset 1 year Seen here on 07/08/13; STD exam was negative Also c/o rash on groin area onset 2 years Alert w/no signs of acute distress.

## 2013-10-21 NOTE — Discharge Instructions (Signed)
Thank you for coming in today. Contact [facility]  Dermatology Clinic  [phone] [place], [place] [address]  Use Triamcinolone ointment twice daily until the skin looks normal.  Repeat lamisil pills.   Take flagyl twice daily for 1 week for urinary symptoms.  Come back as needed.    I recommend that you follow up with a primary doctor to restart warfarin.    Contact Dermatitis Contact dermatitis is a reaction to certain substances that touch the skin. Contact dermatitis can be either irritant contact dermatitis or allergic contact dermatitis. Irritant contact dermatitis does not require previous exposure to the substance for a reaction to occur.Allergic contact dermatitis only occurs if you have been exposed to the substance before. Upon a repeat exposure, your body reacts to the substance.  CAUSES  Many substances can cause contact dermatitis. Irritant dermatitis is most commonly caused by repeated exposure to mildly irritating substances, such as:  Makeup.  Soaps.  Detergents.  Bleaches.  Acids.  Metal salts, such as nickel. Allergic contact dermatitis is most commonly caused by exposure to:  Poisonous plants.  Chemicals (deodorants, shampoos).  Jewelry.  Latex.  Neomycin in triple antibiotic cream.  Preservatives in products, including clothing. SYMPTOMS  The area of skin that is exposed may develop:  Dryness or flaking.  Redness.  Cracks.  Itching.  Pain or a burning sensation.  Blisters. With allergic contact dermatitis, there may also be swelling in areas such as the eyelids, mouth, or genitals.  DIAGNOSIS  Your caregiver can usually tell what the problem is by doing a physical exam. In cases where the cause is uncertain and an allergic contact dermatitis is suspected, a patch skin test may be performed to help determine the cause of your dermatitis. TREATMENT Treatment includes protecting the skin from  further contact with the irritating substance by avoiding that substance if possible. Barrier creams, powders, and gloves may be helpful. Your caregiver may also recommend:  Steroid creams or ointments applied 2 times daily. For best results, soak the rash area in cool water for 20 minutes. Then apply the medicine. Cover the area with a plastic wrap. You can store the steroid cream in the refrigerator for a "chilly" effect on your rash. That may decrease itching. Oral steroid medicines may be needed in more severe cases.  Antibiotics or antibacterial ointments if a skin infection is present.  Antihistamine lotion or an antihistamine taken by mouth to ease itching.  Lubricants to keep moisture in your skin.  Burow's solution to reduce redness and soreness or to dry a weeping rash. Mix one packet or tablet of solution in 2 cups cool water. Dip a clean washcloth in the mixture, wring it out a bit, and put it on the affected area. Leave the cloth in place for 30 minutes. Do this as often as possible throughout the day.  Taking several cornstarch or baking soda baths daily if the area is too large to cover with a washcloth. Harsh chemicals, such as alkalis or acids, can cause skin damage that is like a burn. You should flush your skin for 15 to 20 minutes with cold water after such an exposure. You should also seek immediate medical care after exposure. Bandages (dressings), antibiotics, and pain medicine may be needed for severely irritated skin.  HOME CARE INSTRUCTIONS  Avoid the substance that caused your reaction.  Keep the area of skin that is affected away from hot water, soap, sunlight, chemicals, acidic substances, or  anything else that would irritate your skin.  Do not scratch the rash. Scratching may cause the rash to become infected.  You may take cool baths to help stop the itching.  Only take over-the-counter or prescription medicines as directed by your caregiver.  See your  caregiver for follow-up care as directed to make sure your skin is healing properly. SEEK MEDICAL CARE IF:   Your condition is not better after 3 days of treatment.  You seem to be getting worse.  You see signs of infection such as swelling, tenderness, redness, soreness, or warmth in the affected area.  You have any problems related to your medicines. Document Released: 04/19/2000 Document Revised: 07/15/2011 Document Reviewed: 09/25/2010 [facility] Patient Information 2015 [place], [place]. This information is not intended to replace advice given to you by your health care provider. Make sure you discuss any questions you have with your health care provider.  Urethritis, Adult Urethritis is an inflammation of the tube through which urine exits your bladder (urethra).  CAUSES Urethritis is often caused by an infection in your urethra. The infection can be viral, like herpes. The infection can also be bacterial, like gonorrhea. RISK FACTORS Risk factors of urethritis include:  Having sex without using a condom.  Having multiple sexual partners.  Having poor hygiene. SIGNS AND SYMPTOMS Symptoms of urethritis are less noticeable in women than in men. These symptoms include:  Burning feeling when you urinate (dysuria).  Discharge from your urethra.  Blood in your urine (hematuria).  Urinating more than usual. DIAGNOSIS  To confirm a diagnosis of urethritis, your health care provider will do the following:  Ask about your sexual history.  Perform a physical exam.  Have you provide a sample of your urine for lab testing.  Use a cotton swab to gently collect a sample from your urethra for lab testing. TREATMENT  It is important to treat urethritis. Depending on the cause, untreated urethritis may lead to serious genital infections and possibly infertility. Urethritis caused by a bacterial infection is treated with antibiotics. All sexual partners must be treated.  HOME CARE  INSTRUCTIONS  Do not have sex until the test results are known and treatment is completed, even if your symptoms go away before you finish treatment.  Finish all medicines that you are prescribed. SEEK MEDICAL CARE IF:   Your symptoms are not improved in 3 days.  Your symptoms are getting worse.  You develop abdominal pain or pelvic pain (in women).  You develop joint pain. SEEK IMMEDIATE MEDICAL CARE IF:   You have a fever with a temperature of 101.73F (38.8C) or greater.  You have severe pain in the belly, back, or side.  You have repeated vomiting. Document Released: 10/16/2000 Document Revised: 02/10/2013 Document Reviewed: 12/21/2012 [facility] Patient Information 2015 [place], [place]. This information is not intended to replace advice given to you by your health care provider. Make sure you discuss any questions you have with your health care provider.

## 2013-10-21 NOTE — ED Notes (Signed)
Call back number verified.  
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
## 2013-12-13 NOTE — Telephone Encounter (Signed)
Phone call - encounter closed. 

## 2013-12-30 NOTE — Telephone Encounter (Signed)
Encounter was telephone call. 

## 2014-07-08 ENCOUNTER — Telehealth: Payer: Self-pay | Admitting: Oncology

## 2014-07-08 NOTE — Telephone Encounter (Signed)
Faxed pt medical records to Dr. Bruna PotterBlount [phone]

## 2014-07-12 ENCOUNTER — Telehealth: Payer: Self-pay | Admitting: Oncology

## 2014-07-12 NOTE — Telephone Encounter (Signed)
PRINTED AND MAILED RECORDS TO DR. Bruna PotterBLOUNT ON 07/12/14. [address] [place], [place] [id number]

## 2014-12-01 ENCOUNTER — Emergency Department (HOSPITAL_COMMUNITY): Payer: Self-pay

## 2014-12-01 ENCOUNTER — Emergency Department (HOSPITAL_COMMUNITY)
Admission: EM | Admit: 2014-12-01 | Discharge: 2014-12-01 | Disposition: A | Discharge status: Home / Self Care | Payer: Self-pay | Attending: Emergency Medicine | Admitting: Emergency Medicine

## 2014-12-01 ENCOUNTER — Ambulatory Visit (HOSPITAL_BASED_OUTPATIENT_CLINIC_OR_DEPARTMENT_OTHER): Admit: 2014-12-01 | Discharge: 2014-12-01 | Disposition: A | Discharge status: Home / Self Care | Payer: Self-pay

## 2014-12-01 ENCOUNTER — Encounter (HOSPITAL_COMMUNITY): Payer: Self-pay

## 2014-12-01 DIAGNOSIS — Z720 Tobacco use: Secondary | ICD-10-CM | POA: Insufficient documentation

## 2014-12-01 DIAGNOSIS — I82441 Acute embolism and thrombosis of right tibial vein: Secondary | ICD-10-CM | POA: Insufficient documentation

## 2014-12-01 DIAGNOSIS — R079 Chest pain, unspecified: Secondary | ICD-10-CM | POA: Insufficient documentation

## 2014-12-01 DIAGNOSIS — Z7901 Long term (current) use of anticoagulants: Secondary | ICD-10-CM | POA: Insufficient documentation

## 2014-12-01 DIAGNOSIS — Z202 Contact with and (suspected) exposure to infections with a predominantly sexual mode of transmission: Secondary | ICD-10-CM | POA: Insufficient documentation

## 2014-12-01 DIAGNOSIS — Z792 Long term (current) use of antibiotics: Secondary | ICD-10-CM | POA: Insufficient documentation

## 2014-12-01 DIAGNOSIS — G8929 Other chronic pain: Secondary | ICD-10-CM | POA: Insufficient documentation

## 2014-12-01 DIAGNOSIS — M79609 Pain in unspecified limb: Secondary | ICD-10-CM

## 2014-12-01 DIAGNOSIS — Z8679 Personal history of other diseases of the circulatory system: Secondary | ICD-10-CM | POA: Insufficient documentation

## 2014-12-01 DIAGNOSIS — Z79899 Other long term (current) drug therapy: Secondary | ICD-10-CM | POA: Insufficient documentation

## 2014-12-01 DIAGNOSIS — Z711 Person with feared health complaint in whom no diagnosis is made: Secondary | ICD-10-CM

## 2014-12-01 HISTORY — DX: Acute embolism and thrombosis of unspecified deep veins of unspecified lower extremity: I82.409

## 2014-12-01 LAB — CBC WITH DIFFERENTIAL/PLATELET
Basophils Absolute: 0 10*3/uL (ref 0.0–0.1)
Basophils Relative: 0 % (ref 0–1)
EOS PCT: 2 % (ref 0–5)
Eosinophils Absolute: 0.1 10*3/uL (ref 0.0–0.7)
HEMATOCRIT: 47.1 % (ref 39.0–52.0)
Hemoglobin: 16.1 g/dL (ref 13.0–17.0)
Lymphocytes Relative: 36 % (ref 12–46)
Lymphs Abs: 1.7 10*3/uL (ref 0.7–4.0)
MCH: 34 pg (ref 26.0–34.0)
MCHC: 34.2 g/dL (ref 30.0–36.0)
MCV: 99.6 fL (ref 78.0–100.0)
MONO ABS: 0.9 10*3/uL (ref 0.1–1.0)
Monocytes Relative: 18 % — ABNORMAL HIGH (ref 3–12)
Neutro Abs: 2.1 10*3/uL (ref 1.7–7.7)
Neutrophils Relative %: 44 % (ref 43–77)
Platelets: 178 10*3/uL (ref 150–400)
RBC: 4.73 MIL/uL (ref 4.22–5.81)
RDW: 13.5 % (ref 11.5–15.5)
WBC: 4.8 10*3/uL (ref 4.0–10.5)

## 2014-12-01 LAB — BASIC METABOLIC PANEL
Anion gap: 5 (ref 5–15)
BUN: 7 mg/dL (ref 6–20)
CO2: 28 mmol/L (ref 22–32)
Calcium: 9.1 mg/dL (ref 8.9–10.3)
Chloride: 105 mmol/L (ref 101–111)
Creatinine, Ser: 1.01 mg/dL (ref 0.61–1.24)
GFR calc Af Amer: >60 mL/min (ref >60)
GFR calc non Af Amer: >60 mL/min (ref >60)
Glucose, Bld: 96 mg/dL (ref 65–99)
Potassium: 3.9 mmol/L (ref 3.5–5.1)
Sodium: 138 mmol/L (ref 135–145)

## 2014-12-01 LAB — URINALYSIS, ROUTINE W REFLEX MICROSCOPIC
BILIRUBIN URINE: NEGATIVE
Glucose, UA: NEGATIVE mg/dL
HGB URINE DIPSTICK: NEGATIVE
Ketones, ur: NEGATIVE mg/dL
Leukocytes, UA: NEGATIVE
Nitrite: NEGATIVE
PH: 5.5 (ref 5.0–8.0)
PROTEIN: NEGATIVE mg/dL
Specific Gravity, Urine: 1.019 (ref 1.005–1.030)
Urobilinogen, UA: 0.2 mg/dL (ref 0.0–1.0)

## 2014-12-01 LAB — TROPONIN I: Troponin I: <0.03 ng/mL (ref <0.031)

## 2014-12-01 LAB — PROTIME-INR
INR: 1.06 (ref 0.00–1.49)
Prothrombin Time: 14 seconds (ref 11.6–15.2)

## 2014-12-01 MED ORDER — TRIAMCINOLONE ACETONIDE 0.5 % EX OINT: 1.0000 "application " | TOPICAL_OINTMENT | Freq: Two times a day (BID) | CUTANEOUS | Status: DC

## 2014-12-01 MED ORDER — LIDOCAINE HCL 1 % IJ SOLN
INTRAMUSCULAR | Status: AC
  Administered 2014-12-01: 0.9 mL
  Filled 2014-12-01: qty 20

## 2014-12-01 MED ORDER — AZITHROMYCIN 250 MG PO TABS
1000.0000 mg | ORAL_TABLET | Freq: Once | ORAL | Status: AC
  Administered 2014-12-01: 1000 mg via ORAL
  Filled 2014-12-01: qty 4

## 2014-12-01 MED ORDER — CEFTRIAXONE SODIUM 250 MG IJ SOLR
250.0000 mg | Freq: Once | INTRAMUSCULAR | Status: AC
  Administered 2014-12-01: 250 mg via INTRAMUSCULAR
  Filled 2014-12-01: qty 250

## 2014-12-01 MED ORDER — RIVAROXABAN (XARELTO) EDUCATION KIT FOR DVT/PE PATIENTS
PACK | Freq: Once | Status: AC
  Administered 2014-12-01: 19:00:00
  Filled 2014-12-01: qty 1

## 2014-12-01 MED ORDER — RIVAROXABAN 15 MG PO TABS
15.0000 mg | ORAL_TABLET | Freq: Once | ORAL | Status: AC
  Administered 2014-12-01: 15 mg via ORAL
  Filled 2014-12-01: qty 1

## 2014-12-01 MED ORDER — XARELTO VTE STARTER PACK 15 & 20 MG PO TBPK: 15.0000 mg | ORAL_TABLET | ORAL | Status: DC

## 2014-12-01 NOTE — ED Provider Notes (Signed)
MSE was initiated and I personally evaluated the patient and placed orders (if any) at  4:04 PM on December 01, 2014.  Patient with hx of DVT, presents with worsening leg pain.  Complains of intermittent chest pain.  Hx of PE.  Pain is 6/10.  Is supposed to be anticoagulated with coumadin, but hasn't been taking it for the past year or so.  Given patient's history and symptoms, will move patient to acute side for further workup.   Basic labs and DVT US study ordered.  The patient appears stable so that the remainder of the MSE may be completed by another provider.  Roxy Horseman, PA-C 12/01/14 1607  Rolan Bucco, MD 12/01/14 [phone]
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
## 2014-12-01 NOTE — Progress Notes (Signed)
*  Preliminary Results* Right lower extremity venous duplex completed. Right lower extremity is positive for deep vein thrombosis involving a single right posterior tibial vein. There is no evidence of right Baker's cyst.  Preliminary results discussed with Fayrene Helper, PA-C.  12/01/2014 5:13 PM  Gertie Fey, RVT, RDCS, RDMS

## 2014-12-01 NOTE — Progress Notes (Signed)
[facility] consulted for Xarelto assistance.  Patient without insurance, no pcp.  Patient presents to Ed with [facility] spoke to patient at bedside.  [facility] provided patient with pamphlet to [facility], explained services.  [facility] discussed importance for follow up.  Patient verbalized understanding.  Inpatient pharmacist at bedside to educate patient on Xarelto and provided patient with 30 day free trial card to take to pharmacy with prescription.  [facility] notified patient of which pharmacies to find xarelto starter pack and wrote them in [facility] pamphlet provided to patient.  Patient is agreeable [place] to [facility] for follow up.  EDCM will email CM at [facility] in attempt to schedule patient a follow up appointment.  Patient without further questions.  EDCM discussed patient with EDPA and EDRN.  No further EDCM needs at this time

## 2014-12-01 NOTE — ED Notes (Signed)
Labs delayed d/t procedures.

## 2014-12-01 NOTE — Discharge Instructions (Signed)
You have been diagnosed with having blood clot in your right calf.  Take Xarelto as prescribed as treatment.  Use Kenalog cream for your rash on your thigh.  Follow up at [facility] for further care.    Deep Vein Thrombosis A deep vein thrombosis (DVT) is a blood clot that develops in the deep, larger veins of the leg, arm, or pelvis. These are more dangerous than clots that might form in veins near the surface of the body. A DVT can lead to serious and even life-threatening complications if the clot breaks off and travels in the bloodstream to the lungs.  A DVT can damage the valves in your leg veins so that instead of flowing upward, the blood pools in the lower leg. This is called post-thrombotic syndrome, and it can result in pain, swelling, discoloration, and sores on the leg. CAUSES Usually, several things contribute to the formation of blood clots. Contributing factors include:  The flow of blood slows down.  The inside of the vein is damaged in some way.  You have a condition that makes blood clot more easily. RISK FACTORS Some people are more likely than others to develop blood clots. Risk factors include:   Smoking.  Being overweight (obese).  Sitting or lying still for a long time. This includes long-distance travel, paralysis, or recovery from an illness or surgery. Other factors that increase risk are:   Older age, especially over 80 years of age.  Having a family history of blood clots or if you have already had a blot clot.  Having major or lengthy surgery. This is especially true for surgery on the hip, knee, or belly (abdomen). Hip surgery is particularly high risk.  Having a long, thin tube (catheter) placed inside a vein during a medical procedure.  Breaking a hip or leg.  Having cancer or cancer treatment.  Pregnancy and childbirth.  Hormone changes make the blood clot more easily during pregnancy.  The fetus puts pressure on the veins of the  pelvis.  There is a risk of injury to veins during delivery or a caesarean delivery. The risk is highest just after childbirth.  Medicines containing the male hormone estrogen. This includes birth control pills and hormone replacement therapy.  Other circulation or heart problems.  SIGNS AND SYMPTOMS When a clot forms, it can either partially or totally block the blood flow in that vein. Symptoms of a DVT can include:  Swelling of the leg or arm, especially if one side is much worse.  Warmth and redness of the leg or arm, especially if one side is much worse.  Pain in an arm or leg. If the clot is in the leg, symptoms may be more noticeable or worse when standing or walking. The symptoms of a DVT that has traveled to the lungs (pulmonary embolism, PE) usually start suddenly and include:  Shortness of breath.  Coughing.  Coughing up blood or blood-tinged mucus.  Chest pain. The chest pain is often worse with deep breaths.  Rapid heartbeat. Anyone with these symptoms should get emergency medical treatment right away. Do not wait to see if the symptoms will go away. Call your local emergency services (911 in the U.S.) if you have these symptoms. Do not drive yourself to the hospital. DIAGNOSIS If a DVT is suspected, your health care provider will take a full medical history and perform a physical exam. Tests that also may be required include:  Blood tests, including studies of the  clotting properties of the blood.  Ultrasound to see if you have clots in your legs or lungs.  X-rays to show the flow of blood when dye is injected into the veins (venogram).  Studies of your lungs if you have any chest symptoms. PREVENTION  Exercise the legs regularly. Take a brisk 30-minute walk every day.  Maintain a weight that is appropriate for your height.  Avoid sitting or lying in bed for long periods of time without moving your legs.  Women, particularly those over the age of 40  years, should consider the risks and benefits of taking estrogen medicines, including birth control pills.  Do not smoke, especially if you take estrogen medicines.  Long-distance travel can increase your risk of DVT. You should exercise your legs by walking or pumping the muscles every hour.  Many of the risk factors above relate to situations that exist with hospitalization, either for illness, injury, or elective surgery. Prevention may include medical and nonmedical measures.  Your health care provider will assess you for the need for venous thromboembolism prevention when you are admitted to the hospital. If you are having surgery, your surgeon will assess you the day of or day after surgery. TREATMENT Once identified, a DVT can be treated. It can also be prevented in some circumstances. Once you have had a DVT, you may be at increased risk for a DVT in the future. The most common treatment for DVT is blood-thinning (anticoagulant) medicine, which reduces the blood's tendency to clot. Anticoagulants can stop new blood clots from forming and stop old clots from growing. They cannot dissolve existing clots. Your body does this by itself over time. Anticoagulants can be given by mouth, through an IV tube, or by injection. Your health care provider will determine the best program for you. Other medicines or treatments that may be used are:  Heparin or related medicines (low molecular weight heparin) are often the first treatment for a blood clot. They act quickly. However, they cannot be taken orally and must be given either in shot form or by IV tube.  Heparin can cause a fall in a component of blood that stops bleeding and forms blood clots (platelets). You will be monitored with blood tests to be sure this does not occur.  Warfarin is an anticoagulant that can be swallowed. It takes a few days to start working, so usually heparin or related medicines are used in combination. Once warfarin is  working, heparin is usually stopped.  Factor Xa inhibitor medicines, such as rivaroxaban and apixaban, also reduce blood clotting. These medicines are taken orally and can often be used without heparin or related medicines.  Less commonly, clot dissolving drugs (thrombolytics) are used to dissolve a DVT. They carry a high risk of bleeding, so they are used mainly in severe cases where your life or a part of your body is threatened.  Very rarely, a blood clot in the leg needs to be removed surgically.  If you are unable to take anticoagulants, your health care provider may arrange for you to have a filter placed in a main vein in your abdomen. This filter prevents clots from traveling to your lungs. HOME CARE INSTRUCTIONS  Take all medicines as directed by your health care provider.  Learn as much as you can about DVT.  Wear a medical alert bracelet or carry a medical alert card.  Ask your health care provider how soon you can go back to normal activities. It is important to  stay active to prevent blood clots. If you are on anticoagulant medicine, avoid contact sports.  It is very important to exercise. This is especially important while traveling, sitting, or standing for long periods of time. Exercise your legs by walking or by tightening and relaxing your leg muscles regularly. Take frequent walks.  You may need to wear compression stockings. These are tight elastic stockings that apply pressure to the lower legs. This pressure can help keep the blood in the legs from clotting. Taking Warfarin Warfarin is a daily medicine that is taken by mouth. Your health care provider will advise you on the length of treatment (usually 3-6 months, sometimes lifelong). If you take warfarin:  Understand how to take warfarin and foods that can affect how warfarin works in Veterinary surgeon.  Too much and too little warfarin are both dangerous. Too much warfarin increases the risk of bleeding. Too little warfarin  continues to allow the risk for blood clots. Warfarin and Regular Blood Testing While taking warfarin, you will need to have regular blood tests to measure your blood clotting time. These blood tests usually include both the prothrombin time (PT) and international normalized ratio (INR) tests. The PT and INR results allow your health care provider to adjust your dose of warfarin. It is very important that you have your PT and INR tested as often as directed by your health care provider.  Warfarin and Your Diet Avoid major changes in your diet, or notify your health care provider before changing your diet. Arrange a visit with a registered dietitian to answer your questions. Many foods, especially foods high in vitamin K, can interfere with warfarin and affect the PT and INR results. You should eat a consistent amount of foods high in vitamin K. Foods high in vitamin K include:   Spinach, kale, broccoli, cabbage, collard and turnip greens, Brussels sprouts, peas, cauliflower, seaweed, and parsley.  Beef and pork liver.  Green tea.  Soybean oil. Warfarin with Other Medicines Many medicines can interfere with warfarin and affect the PT and INR results. You must:  Tell your health care provider about any and all medicines, vitamins, and supplements you take, including aspirin and other over-the-counter anti-inflammatory medicines. Be especially cautious with aspirin and anti-inflammatory medicines. Ask your health care provider before taking these.  Do not take or discontinue any prescribed or over-the-counter medicine except on the advice of your health care provider or pharmacist. Warfarin Side Effects Warfarin can have side effects, such as easy bruising and difficulty stopping bleeding. Ask your health care provider or pharmacist about other side effects of warfarin. You will need to:  Hold pressure over cuts for longer than usual.  Notify your dentist and other health care providers that  you are taking warfarin before you undergo any procedures where bleeding may occur. Warfarin with Alcohol and Tobacco   Drinking alcohol frequently can increase the effect of warfarin, leading to excess bleeding. It is best to avoid alcoholic drinks or to consume only very small amounts while taking warfarin. Notify your health care provider if you change your alcohol intake.   Do not use any tobacco products including cigarettes, chewing tobacco, or electronic cigarettes. If you smoke, quit. Ask your health care provider for help with quitting smoking. Alternative Medicines to Warfarin: Factor Xa Inhibitor Medicines  These blood-thinning medicines are taken by mouth, usually for several weeks or longer. It is important to take the medicine every single day at the same time each day.  There  are no regular blood tests required when using these medicines.  There are fewer food and drug interactions than with warfarin.  The side effects of this class of medicine are similar to those of warfarin, including excessive bruising or bleeding. Ask your health care provider or pharmacist about other potential side effects. SEEK MEDICAL CARE IF:  You notice a rapid heartbeat.  You feel weaker or more tired than usual.  You feel faint.  You notice increased bruising.  You feel your symptoms are not getting better in the time expected.  You believe you are having side effects of medicine. SEEK IMMEDIATE MEDICAL CARE IF:  You have chest pain.  You have trouble breathing.  You have new or increased swelling or pain in one leg.  You cough up blood.  You notice blood in vomit, in a bowel movement, or in urine. MAKE SURE YOU:  Understand these instructions.  Will watch your condition.  Will get help right away if you are not doing well or get worse. Document Released: 04/22/2005 Document Revised: 09/06/2013 Document Reviewed: 12/28/2012 [facility] Patient Information 2015 [place], [place].  This information is not intended to replace advice given to you by your health care provider. Make sure you discuss any questions you have with your health care provider.  Rivaroxaban (Xarelto) ED Discharge Instructions   Patient received a prescription for  Xarelto 15 & 20 mg - 51 tablet VTE STARTER PACK.   Patient understands only the FIRST 30 DAYS OF TREATMENT  will be provided by the starter pack.   Patient understands to contact primary care doctor or ED immediately if for any reason is unable to fill the starter pack prescription.  Patient must schedule a follow-up appointment with primary care doctor within 15 days of discharge in order to receive the maintenance prescription and clinical follow up.  Patient has received an education kit containing (CarePath Trial Offer Card, DVT/PE brochure, Dosing Diary, and Xarelto Medication Guide).   If not performed in the ED, patient will receive medication counseling by a [facility] pharmacist via phone follow-up within the next 72 hours. Pharmacist to review signs and symptoms of bleeding and proper use of this medication.   Call 911 or return immediately to the nearest ED if you develop bleeding (e.g. nose, gums, vomit, urine, bloody or dark stools), unusual bruising, head trauma (even if minor), severe headache, altered mental status, change in speech, weakness on one side of body, shortness of breath, swollen lips/tongue/face/neck, chest pain, or other concerns.    Information on my medicine - XARELTO (rivaroxaban)  This medication education was provided to me or my healthcare representative as part of my discharge instructions.   WHY WAS XARELTO PRESCRIBED FOR YOU?  Xarelto was prescribed to treat blood clots that may have been found in the veins of your legs (deep vein thrombosis) or in your lungs (pulmonary embolism) and to reduce the risk of them occurring again.   WHAT DO YOU NEED TO KNOW ABOUT XARELTO?  The starting dose is  one 15 mg tablet taken TWICE daily with food for the FIRST 21 DAYS then on day 22 the dose is changed to one 20 mg tablet taken ONCE A DAY with your evening meal.   DO NOT stop taking Xarelto without talking to the health care provider who prescribed the medication. Refill your prescription for 20 mg tablets before you run out.  After discharge, you should have regular check-up appointments with your healthcare provider that is prescribing  your Xarelto. In the future your dose may need to be changed if your kidney function changes by a significant amount.   WHAT DO YOU DO IF YOU MISS A DOSE?  If you are taking Xarelto TWICE DAILY and you miss a dose, take it as soon as you remember. You may take two 15 mg tablets (total 30 mg) at the same time then resume your regularly scheduled 15 mg twice daily the next day.   If you are taking Xarelto ONCE DAILY and you miss a dose, take it as soon as you remember on the same day then continue your regularly scheduled once daily regimen the next day. Do not take two doses of Xarelto at the same time.   IMPORTANT SAFETY INFORMATION  Xarelto is a blood thinner medicine that can cause bleeding. You should call your healthcare provider right away if you experience any of the following:  -  Bleeding from an injury or your nose that does not stop.  -  Unusual colored urine (red or dark brown) or unusual colored stools (red or black).  -  Unusual bruising for unknown reasons.  -  A serious fall or if you hit your head (even if there is no bleeding).   Some medicines may interact with Xarelto and might increase your risk of bleeding while on Xarelto. To help avoid this, consult your healthcare provider or pharmacist prior to using any new prescription or non-prescription medications, including herbals, vitamins, non-steroidal anti-inflammatory drugs (NSAIDs) and supplements.   This website has more information on Xarelto: [email or web address]   Emergency  Department Resource Guide 1) Find a Doctor and Pay Out of Pocket Although you won't have to find out who is covered by your insurance plan, it is a good idea to ask around and get recommendations. You will then need to call the office and see if the doctor you have chosen will accept you as a new patient and what types of options they offer for patients who are self-pay. Some doctors offer discounts or will set up payment plans for their patients who do not have insurance, but you will need to ask so you aren't surprised when you get to your appointment.  2) Contact Your Local Health Department Not all health departments have doctors that can see patients for sick visits, but many do, so it is worth a call to see if yours does. If you don't know where your local health department is, you can check in your phone book. The CDC also has a tool to help you locate your state's health department, and many state websites also have listings of all of their local health departments.  3) Find a [facility] If your illness is not likely to be very severe or complicated, you may want to try a walk in clinic. These are popping up all over the country in pharmacies, drugstores, and shopping centers. They're usually staffed by nurse practitioners or physician assistants that have been trained to treat common illnesses and complaints. They're usually fairly quick and inexpensive. However, if you have serious medical issues or chronic medical problems, these are probably not your best option.  No Primary Care Doctor: - Call Health Connect at  [phone] - they can help you locate a primary care doctor that  accepts your insurance, provides certain services, etc. - Physician Referral Service- [phone]  Chronic Pain Problems: Organization         Address  Phone   Notes  [facility]  [phone] Patients need to be referred by their primary care doctor.   Medication  Assistance: Organization         Address  Phone   Notes  [facility] Medication [facility] [address] [place], [place] [address] [phone] --Must be a resident of [facility] -- Must have NO insurance coverage whatsoever (no Medicaid/ Medicare, etc.) -- The pt. MUST have a primary care doctor that directs their care regularly and follows them in the community   [facility]  [phone]   [employer]  [phone]    Agencies that provide inexpensive medical care: Organization         Address  Phone   Notes  [place]  [phone]   [facility]    [phone]   [facility] [place], [place] [address] [phone]   [place] [address], [place] [phone]   Planned Parenthood    [phone]   [facility]    [phone]   [place] and [place] [address], [place] Phone:  [phone], Fax:  [phone] Hours of Operation:  9 am - 6 pm, M-F.  Also accepts Medicaid/Medicare and self-pay.  [facility] for [place], [address], [place] Phone: [phone], Fax: [phone]. Hours of Operation:  8:30 am - 5:30 pm, M-F.  Also accepts Medicaid and self-pay.  [facility] [place] [address], [place] Phone: [phone]   [place], [place], [place] [phone], Ext. [phone] Mondays & Thursdays: 7-9 AM.  First 15 patients are seen on a first come, first serve basis.    [place] Providers:  Organization         Address  Phone   Notes  [facility] [address], [place] [phone] Also accepts self-pay patients.  [facility] [address], [place]  [phone]   [place], [address], [place]  [phone]   [facility] [address] [place] [phone]   Lucianne Lei [address], [place]   [phone] Only accepts [place] patients after they have their name applied to their card.   Self-Pay (no insurance) in [facility]:  Organization         Address  Phone   Notes  Sickle Cell Patients, [facility] [place] [phone]   [facility] [place] [phone]   [facility] [place]  [place], [place], [place] [phone]   [facility]/Dr. Osei-Bonsu  [address], [place] or [address], [place] [phone] Phone number for both [place] and [place] locations is the same.  [facility] [address], [place] [phone]   [facility] [address], [place] or [address] [phone]   [facility] [address] [place] [phone], phone; [phone], fax Sees patients 1st and 3rd Saturday of every month.  Must not qualify for public or private insurance (i.e. Medicaid, Medicare, [place]  Health Choice, Veterans' Benefits)  Household income should be no more than 200% of the poverty level The clinic cannot treat you if you are pregnant or think you are pregnant  Sexually transmitted diseases are not treated at the clinic.    Dental Care: Organization         Address  Phone  Notes  [facility] Department of [facility] [place] [phone] Accepts children up to age 50 who are enrolled in [place] or [place]; pregnant women with a Medicaid card; and children who have applied for Medicaid or [place] Health Choice, but were declined, whose parents can pay a reduced fee at time of service.  [facility]  Department of [facility]  [address], [place] [phone] Accepts children up to age 22 who are enrolled in [place] or [place]; pregnant women with a Medicaid card; and children who have applied for Medicaid or [place] Health Choice, but were declined, whose parents can pay a reduced fee at time of service.  [facility]  [place] [phone] Patients are seen by appointment only. Walk-ins are not accepted. [place] will see patients 96 years of age and older. Monday - Tuesday (8am-5pm) Most Wednesdays (8:30-5pm) $30 per visit, cash only  [facility] Adult Dental Access PROGRAM  [address], [facility] [phone] Patients are seen by appointment only. Walk-ins are not accepted. [place] will see patients 35 years of age and older. One Wednesday Evening (Monthly: Volunteer Based).  $30 per visit, cash only  [place]  [phone] for adults; Children under age 58, call Graduate Pediatric Dentistry at [phone]. Children aged 73-14, please call [phone] to request a pediatric application.  Dental services are provided in all areas of dental care including fillings, crowns and bridges, complete and partial dentures, implants, gum treatment, root canals, and extractions. Preventive care is also provided. Treatment is provided to both adults and children. Patients are selected via a lottery and there is often a waiting list.   [facility] [address] [place]  [phone] [email or web address]   [facility] [address] [place], [place] [phone], Ext. [phone] Second and Fourth Thursday of each month, opens at 6:30 AM; Clinic ends at 9 AM.  Patients are seen on a first-come first-served basis, and a limited number are seen during each clinic.   [facility]  [address] [place], [place] [phone]    Eligibility Requirements You must have lived in [place], [place], or [place] counties for at least the last three months.   You cannot be eligible for state or federal sponsored [employer], including [employer], [place], or [employer].   You generally cannot be eligible for healthcare insurance through your employer.    How to apply: Eligibility screenings are held every Tuesday and Wednesday afternoon from 1:00 pm until 4:00 pm. You do not need an appointment for the interview!  [facility] [address] [place], [place]   [place]  [place] Department  [place]  [phone]    Behavioral Health Resources in the Community: Intensive Outpatient Programs Organization         Address  Phone  Notes  [place] [phone]  [address] [place], [place] [phone]   [facility] [facility] [address], [place], [place]   ADS: [facility] [address], [place], [place]   [place] [address]  [place], [place] or [phone]   Substance Abuse Resources Organization         Address  Phone  Notes  [facility]  [phone]   [place]  [phone]   [facility] [place]   Chinita Pester  [phone]   Residential & Outpatient Substance Abuse Program  [phone]   Psychological Services Organization         Address  Phone  Notes  [facility] [place]  [place]  [phone]   [place] [address] [place] or [phone]    Mobile Crisis Teams Organization         Address  Phone  Notes  [facility], Mobile Crisis Care Unit  [phone]   [facility]  [address]  [place], [place]   [place] Levels [address], [place] [phone]    Self-Help/Support Groups Organization         Address  Phone             Notes  [place] [place] - variety of support groups  [place] Call for more information  Narcotics Anonymous (NA), [facility] [address], [employer]   2 meetings at this location   Special educational needs teacher         Address  Phone  Notes  [facility] [place],    [place]  [phone]   [facility]  [address], [place] [id number], [place], [place]   [place], [place] [phone] Admissions: 8am-3pm M-F  [facility] [place] [address]    [place], [place] [phone]   [facility] [address] [place], [place], [place]   [facility] [address],  [place], [place]   [facility] - Intensive Outpatient [address], [place], [place]   [facility]  [place], [place] [phone] or [phone]   [facility] ([facility]) [address], [place], [place] Accepts Medicaid  [facility] [address],  [place] [phone] Substance Abuse/Addiction Treatment   [facility] Organization         Address  Phone  Notes  [facility]  [phone]   Domenic Schwab, PhD [address] Arlis Porta [place], [place]   [phone] or [phone]   [place], [place] [phone]   [facility] [address] [place], [place] [phone] Insurance/Medicaid/sponsorship through [facility] and Families [address], [address] [place]                                    [place], [place] [phone] [place] [address], [place] [phone]    Dr. Adele Schilder  [phone]   [facility] Dept. 1) [address] [place] 2) [address] [place] 3)  [address], [place] [phone]  [phone]   [place] [phone] or [phone] (After Hours)       Information on my medicine - XARELTO (rivaroxaban)  This medication education was reviewed with me or my healthcare representative as part of my discharge preparation.  The pharmacist that spoke with me during my hospital stay was:  [place]? Xarelto was prescribed to treat blood clots that may have been found in the veins of your legs (deep vein thrombosis) or in your lungs (pulmonary embolism) and to reduce the risk of them occurring again.  What do you need to know about Xarelto? The starting dose is one 15 mg tablet taken TWICE daily with food for the FIRST 21 DAYS then on 12/23/14  the dose is changed to one 20 mg tablet taken ONCE A DAY with your evening meal.  DO NOT stop taking Xarelto without talking to the health care provider who prescribed the medication.  Refill your prescription for 20 mg tablets before you run out.  After discharge, you should have regular check-up appointments with your healthcare provider that is prescribing your Xarelto.  In the future your dose may need to be changed if your kidney function changes by a significant amount.  What do you do if you miss a dose? If you are taking Xarelto TWICE DAILY and you miss a dose, take it as soon as you remember. You may take two 15 mg tablets (total 30 mg) at the same time then resume your regularly scheduled 15 mg twice daily the next day.  If you are taking Xarelto ONCE DAILY and you miss a dose, take it as soon as you remember on the same day then continue your regularly  scheduled once daily regimen the next day. Do not take two doses of Xarelto at the same time.   Important Safety Information Xarelto is a blood thinner medicine that can cause bleeding. You should call your healthcare provider right away if you experience any of the following: ? Bleeding from an injury or your nose that does not stop. ? Unusual colored urine (red or dark brown) or unusual colored stools (red or black). ? Unusual bruising for unknown reasons. ? A serious fall or if you hit your head (even if there is no bleeding).  Some medicines may interact with Xarelto and might increase your risk of bleeding while on Xarelto. To help avoid this, consult your healthcare provider or pharmacist prior to using any new prescription or non-prescription medications, including herbals, vitamins, non-steroidal anti-inflammatory drugs (NSAIDs) and supplements.  This website has more information on Xarelto: [email or web address]

## 2014-12-01 NOTE — ED Notes (Signed)
Pt c/o intermittent RLE pain x 1.5 years.  Pain score 6/10.  Pt would like to be checked for blood clots.  No redness, swelling, or warmth noted.

## 2014-12-01 NOTE — ED Notes (Signed)
Delay in lab draw pt changing into gown.

## 2014-12-02 ENCOUNTER — Telehealth: Payer: Self-pay

## 2014-12-02 LAB — GC/CHLAMYDIA PROBE AMP (~~LOC~~) NOT AT ARMC
Chlamydia: NEGATIVE
NEISSERIA GONORRHEA: NEGATIVE

## 2014-12-02 LAB — HIV ANTIBODY (ROUTINE TESTING W REFLEX): HIV Screen 4th Generation wRfx: NONREACTIVE

## 2014-12-02 LAB — RPR: RPR Ser Ql: NONREACTIVE

## 2014-12-02 NOTE — Telephone Encounter (Signed)
Attempted to contact the patient again to discuss scheduling an appointment at [facility] and to discuss resources for obtaining the xarelto.  Called # [phone] and left a voice mail message requesting a call back to # [phone] nor [phone].

## 2014-12-02 NOTE — Progress Notes (Signed)
EDCM received return phone call from patient.  Patient reports "i just woke up."  Patient confirms he was able to get his prescription filled for Xarelto Starter pack last evening.  Patient reports he attempted to call the [facility], but it was closed.  EDCM instructed to call the [facility] first thing Monday morning so that he may receive the assistance he needs.  Patient verbalized understanding.  No further EDCM needs at this time.

## 2014-12-02 NOTE — Progress Notes (Signed)
[facility] called patient for follow up without success.  EDCM left voice message stating [facility] can provide him assistance with cost of xarelto and appointment for follow up.  EDCM left phone number for call back.

## 2014-12-02 NOTE — Telephone Encounter (Signed)
Referral received from Radford Pax, CM requesting a hospital follow up appointment be scheduled for the patient as well as assistance w/ the cost of xarelto. This CM attempted to reach the patient this morning at [phone].  Voice mail message left requesting a call bck to # [phone] or [phone].  Called # [phone] and the message states that it is not a working number.

## 2014-12-03 ENCOUNTER — Encounter (HOSPITAL_COMMUNITY): Payer: Self-pay | Admitting: Emergency Medicine

## 2014-12-03 ENCOUNTER — Emergency Department (HOSPITAL_COMMUNITY)
Admission: EM | Admit: 2014-12-03 | Discharge: 2014-12-03 | Disposition: A | Discharge status: Home / Self Care | Payer: Self-pay | Attending: Emergency Medicine | Admitting: Emergency Medicine

## 2014-12-03 DIAGNOSIS — Z86711 Personal history of pulmonary embolism: Secondary | ICD-10-CM | POA: Insufficient documentation

## 2014-12-03 DIAGNOSIS — Z720 Tobacco use: Secondary | ICD-10-CM | POA: Insufficient documentation

## 2014-12-03 DIAGNOSIS — G8929 Other chronic pain: Secondary | ICD-10-CM | POA: Insufficient documentation

## 2014-12-03 DIAGNOSIS — I82401 Acute embolism and thrombosis of unspecified deep veins of right lower extremity: Secondary | ICD-10-CM | POA: Insufficient documentation

## 2014-12-03 MED ORDER — HYDROCODONE-ACETAMINOPHEN 5-325 MG PO TABS
2.0000 | ORAL_TABLET | Freq: Once | ORAL | Status: AC
  Administered 2014-12-03: 2 via ORAL
  Filled 2014-12-03: qty 2

## 2014-12-03 MED ORDER — HYDROCODONE-ACETAMINOPHEN 5-325 MG PO TABS: 1.0000 | ORAL_TABLET | ORAL | Status: DC | PRN

## 2014-12-03 NOTE — ED Notes (Signed)
Pt. reports persistent right leg pain for several weeks denies injury / ambulatory , seen at [facility] 2 days ago diagnosed with DVT prescribed with Xarelto , denies SOB , no fever or chills.

## 2014-12-03 NOTE — Discharge Instructions (Signed)
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
## 2014-12-05 ENCOUNTER — Telehealth: Payer: Self-pay

## 2014-12-05 NOTE — Telephone Encounter (Signed)
Attempted to contact the patient at # [phone] to schedule a follow up appointment at [facility] and discuss the need to meet with the pharmacist regarding obtaining assistance for his xarelto. Voice mail message left requesting a call back to # [phone] or [phone].  Call placed to # [phone] and the message noted that the phone number is not in service.

## 2014-12-06 ENCOUNTER — Telehealth: Payer: Self-pay

## 2014-12-06 NOTE — Telephone Encounter (Signed)
Called the patient to check on his status.  He confirmed that he has the xarelto and has been taking it and confirmed his schedule for taking it.  He said that the pain in his right leg is "better."  He was agreeable to scheduling an appointment at [facility] and a follow up appointment was scheduled for 12/12/14 @ 1400 w/ Dr Venetia Night. This CM also confirmed the address w/ him and he was able to repeat back the day and time of the appointment and he said that he has transportation to the appointment.  This CM also asked him to bring all of his medications to his appointment and he verbalized understanding.

## 2014-12-12 ENCOUNTER — Encounter: Payer: Self-pay | Admitting: Family Medicine

## 2014-12-12 ENCOUNTER — Ambulatory Visit: Payer: Self-pay | Attending: Family Medicine | Admitting: Family Medicine

## 2014-12-12 VITALS — BP 117/71 | HR 76 | Temp 98.1°F | Ht 72.0 in | Wt 177.0 lb

## 2014-12-12 DIAGNOSIS — M792 Neuralgia and neuritis, unspecified: Secondary | ICD-10-CM | POA: Insufficient documentation

## 2014-12-12 DIAGNOSIS — M25562 Pain in left knee: Secondary | ICD-10-CM

## 2014-12-12 DIAGNOSIS — Z79899 Other long term (current) drug therapy: Secondary | ICD-10-CM | POA: Insufficient documentation

## 2014-12-12 DIAGNOSIS — Z7901 Long term (current) use of anticoagulants: Secondary | ICD-10-CM | POA: Insufficient documentation

## 2014-12-12 DIAGNOSIS — G629 Polyneuropathy, unspecified: Secondary | ICD-10-CM | POA: Insufficient documentation

## 2014-12-12 DIAGNOSIS — D6859 Other primary thrombophilia: Secondary | ICD-10-CM | POA: Insufficient documentation

## 2014-12-12 DIAGNOSIS — F17210 Nicotine dependence, cigarettes, uncomplicated: Secondary | ICD-10-CM | POA: Insufficient documentation

## 2014-12-12 DIAGNOSIS — R21 Rash and other nonspecific skin eruption: Secondary | ICD-10-CM | POA: Insufficient documentation

## 2014-12-12 DIAGNOSIS — D6852 Prothrombin gene mutation: Secondary | ICD-10-CM

## 2014-12-12 DIAGNOSIS — M79605 Pain in left leg: Secondary | ICD-10-CM | POA: Insufficient documentation

## 2014-12-12 DIAGNOSIS — I82401 Acute embolism and thrombosis of unspecified deep veins of right lower extremity: Secondary | ICD-10-CM | POA: Insufficient documentation

## 2014-12-12 LAB — VITAMIN B12: Vitamin B-12: 436 pg/mL (ref 211–911)

## 2014-12-12 MED ORDER — TRIAMCINOLONE ACETONIDE 0.5 % EX OINT: 1.0000 "application " | TOPICAL_OINTMENT | Freq: Two times a day (BID) | CUTANEOUS | Status: DC

## 2014-12-12 MED ORDER — TRAMADOL HCL 50 MG PO TABS: 50.0000 mg | ORAL_TABLET | Freq: Three times a day (TID) | ORAL | Status: DC | PRN

## 2014-12-12 MED ORDER — RIVAROXABAN 20 MG PO TABS: 20.0000 mg | ORAL_TABLET | Freq: Every day | ORAL | Status: DC

## 2014-12-12 NOTE — Progress Notes (Signed)
Patient has a clotting disorder and has had his second DVT in his left lower leg Rates pain as constant 7-8 stabbing in nature-is taking nothing for pain Has his Xarelto starter pack

## 2014-12-12 NOTE — Patient Instructions (Signed)
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
## 2014-12-14 ENCOUNTER — Telehealth: Payer: Self-pay | Admitting: *Deleted

## 2014-12-14 NOTE — Telephone Encounter (Signed)
-----   Message from Jaclyn Shaggy, MD sent at 12/13/2014  8:15 AM EDT ----- Please inform the patient that labs are normal. Thank you.

## 2014-12-14 NOTE — Telephone Encounter (Signed)
Left HIPAA compliant message for patient to return call. 

## 2014-12-15 ENCOUNTER — Telehealth: Payer: Self-pay | Admitting: *Deleted

## 2014-12-15 NOTE — Telephone Encounter (Signed)
Left HIPAA compliant message for patient to return my call. 

## 2014-12-15 NOTE — Telephone Encounter (Signed)
-----   Message from Enobong Amao, MD sent at 12/13/2014  8:15 AM EDT ----- Please inform the patient that labs are normal. Thank you. 

## 2014-12-30 ENCOUNTER — Encounter: Payer: Self-pay | Admitting: Family Medicine

## 2014-12-30 ENCOUNTER — Ambulatory Visit: Payer: Self-pay | Attending: Family Medicine | Admitting: Family Medicine

## 2014-12-30 VITALS — BP 95/57 | HR 119 | Temp 97.5°F | Resp 18 | Ht 72.0 in | Wt 176.0 lb

## 2014-12-30 DIAGNOSIS — I82401 Acute embolism and thrombosis of unspecified deep veins of right lower extremity: Secondary | ICD-10-CM | POA: Insufficient documentation

## 2014-12-30 DIAGNOSIS — R369 Urethral discharge, unspecified: Secondary | ICD-10-CM | POA: Insufficient documentation

## 2014-12-30 DIAGNOSIS — M79645 Pain in left finger(s): Secondary | ICD-10-CM | POA: Insufficient documentation

## 2014-12-30 DIAGNOSIS — B029 Zoster without complications: Secondary | ICD-10-CM | POA: Insufficient documentation

## 2014-12-30 MED ORDER — TRIAMCINOLONE ACETONIDE 0.5 % EX OINT: 1.0000 "application " | TOPICAL_OINTMENT | Freq: Two times a day (BID) | CUTANEOUS | Status: DC

## 2014-12-30 MED ORDER — VALACYCLOVIR HCL 1 G PO TABS: 1000.0000 mg | ORAL_TABLET | Freq: Three times a day (TID) | ORAL | Status: DC

## 2014-12-30 NOTE — Progress Notes (Signed)
Patient here for 2 week follow up for blood clots.  Patient thinks he has STD, patient reports having a drip. Last time he had a drip he had STD.   Patient thinks he has shingles due to bumps on thigh, hurt and itch, very red.   Patient reports thinking he has a broken thumb.   Patient denies pain at this time.  Patient needs refill for triamcinolone ointment.
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
## 2014-12-30 NOTE — Patient Instructions (Signed)

## 2015-01-02 LAB — GC/CHLAMYDIA PROBE AMP (~~LOC~~) NOT AT ARMC
Chlamydia: NEGATIVE
Neisseria Gonorrhea: NEGATIVE

## 2015-01-03 ENCOUNTER — Telehealth: Payer: Self-pay | Admitting: *Deleted

## 2015-01-03 NOTE — Telephone Encounter (Signed)
-----   Message from Jaclyn Shaggy, MD sent at 01/02/2015  9:00 PM EDT ----- Please inform the patient that labs are normal. Thank you.

## 2015-01-03 NOTE — Telephone Encounter (Signed)
Left HIPAA compliant message on voicemail for patient to return my call at [phone]

## 2015-01-05 ENCOUNTER — Telehealth: Payer: Self-pay | Admitting: *Deleted

## 2015-01-05 NOTE — Telephone Encounter (Signed)
Verified name and date of birth and gave negative lab results to patient.  Patient verbalized understanding.

## 2015-01-05 NOTE — Telephone Encounter (Signed)
-----   Message from Enobong Amao, MD sent at 01/02/2015  9:00 PM EDT ----- Please inform the patient that labs are normal. Thank you. 

## 2015-01-14 ENCOUNTER — Encounter (HOSPITAL_COMMUNITY): Payer: Self-pay | Admitting: *Deleted

## 2015-01-14 ENCOUNTER — Emergency Department (HOSPITAL_COMMUNITY)
Admission: EM | Admit: 2015-01-14 | Discharge: 2015-01-14 | Disposition: A | Discharge status: Home / Self Care | Payer: Self-pay | Attending: Emergency Medicine | Admitting: Emergency Medicine

## 2015-01-14 DIAGNOSIS — Z7901 Long term (current) use of anticoagulants: Secondary | ICD-10-CM | POA: Insufficient documentation

## 2015-01-14 DIAGNOSIS — M542 Cervicalgia: Secondary | ICD-10-CM

## 2015-01-14 DIAGNOSIS — Y9289 Other specified places as the place of occurrence of the external cause: Secondary | ICD-10-CM | POA: Insufficient documentation

## 2015-01-14 DIAGNOSIS — Z8679 Personal history of other diseases of the circulatory system: Secondary | ICD-10-CM | POA: Insufficient documentation

## 2015-01-14 DIAGNOSIS — S199XXA Unspecified injury of neck, initial encounter: Secondary | ICD-10-CM | POA: Insufficient documentation

## 2015-01-14 DIAGNOSIS — Z720 Tobacco use: Secondary | ICD-10-CM | POA: Insufficient documentation

## 2015-01-14 DIAGNOSIS — G8929 Other chronic pain: Secondary | ICD-10-CM | POA: Insufficient documentation

## 2015-01-14 DIAGNOSIS — S0990XA Unspecified injury of head, initial encounter: Secondary | ICD-10-CM | POA: Insufficient documentation

## 2015-01-14 DIAGNOSIS — J029 Acute pharyngitis, unspecified: Secondary | ICD-10-CM | POA: Insufficient documentation

## 2015-01-14 DIAGNOSIS — Z86711 Personal history of pulmonary embolism: Secondary | ICD-10-CM | POA: Insufficient documentation

## 2015-01-14 DIAGNOSIS — Y9389 Activity, other specified: Secondary | ICD-10-CM | POA: Insufficient documentation

## 2015-01-14 DIAGNOSIS — Z86718 Personal history of other venous thrombosis and embolism: Secondary | ICD-10-CM | POA: Insufficient documentation

## 2015-01-14 DIAGNOSIS — Y998 Other external cause status: Secondary | ICD-10-CM | POA: Insufficient documentation

## 2015-01-14 MED ORDER — METHOCARBAMOL 500 MG PO TABS: 500.0000 mg | ORAL_TABLET | Freq: Two times a day (BID) | ORAL | Status: DC

## 2015-01-14 MED ORDER — ACETAMINOPHEN 500 MG PO TABS: 500.0000 mg | ORAL_TABLET | Freq: Four times a day (QID) | ORAL | Status: DC | PRN
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
## 2015-01-30 ENCOUNTER — Ambulatory Visit: Payer: Self-pay | Admitting: Internal Medicine

## 2015-04-26 ENCOUNTER — Emergency Department (INDEPENDENT_AMBULATORY_CARE_PROVIDER_SITE_OTHER)
Admission: EM | Admit: 2015-04-26 | Discharge: 2015-04-26 | Disposition: A | Discharge status: Home / Self Care | Payer: Self-pay | Source: Home / Self Care | Attending: Family Medicine | Admitting: Family Medicine

## 2015-04-26 ENCOUNTER — Emergency Department (INDEPENDENT_AMBULATORY_CARE_PROVIDER_SITE_OTHER): Payer: Self-pay

## 2015-04-26 ENCOUNTER — Encounter (HOSPITAL_COMMUNITY): Payer: Self-pay | Admitting: *Deleted

## 2015-04-26 DIAGNOSIS — M779 Enthesopathy, unspecified: Principal | ICD-10-CM

## 2015-04-26 DIAGNOSIS — M778 Other enthesopathies, not elsewhere classified: Secondary | ICD-10-CM

## 2015-04-26 DIAGNOSIS — M6588 Other synovitis and tenosynovitis, other site: Secondary | ICD-10-CM

## 2015-04-26 MED ORDER — TRAMADOL HCL 50 MG PO TABS: 50.0000 mg | ORAL_TABLET | Freq: Four times a day (QID) | ORAL | Status: DC | PRN

## 2015-04-26 MED ORDER — MELOXICAM 7.5 MG PO TABS: 7.5000 mg | ORAL_TABLET | Freq: Two times a day (BID) | ORAL | Status: DC

## 2015-04-26 NOTE — ED Notes (Signed)
Pt  Reports  r  Hand  Pain  Foe  Several days     denys  Any  specefic  Injury  No obvious  Deformity  Noted         History   Of blood  Clots  In past

## 2015-04-26 NOTE — Discharge Instructions (Signed)
Use ice , splint and medicine as needed, see orthopedist if further problems.
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
## 2015-04-28 ENCOUNTER — Ambulatory Visit: Payer: Self-pay | Admitting: Family Medicine

## 2015-07-03 ENCOUNTER — Other Ambulatory Visit: Payer: Self-pay | Admitting: General Practice

## 2015-07-03 DIAGNOSIS — D6859 Other primary thrombophilia: Secondary | ICD-10-CM

## 2015-07-03 NOTE — Telephone Encounter (Signed)
Patient called requesting a medication refill for valtrex, xarelto and triamcinolone ointment. Please follow up.

## 2015-07-04 MED ORDER — TRIAMCINOLONE ACETONIDE 0.5 % EX OINT: 1.0000 "application " | TOPICAL_OINTMENT | Freq: Two times a day (BID) | CUTANEOUS | Status: DC

## 2015-07-04 MED ORDER — RIVAROXABAN 20 MG PO TABS: 20.0000 mg | ORAL_TABLET | Freq: Every day | ORAL | Status: DC

## 2015-07-04 MED ORDER — VALACYCLOVIR HCL 1 G PO TABS: 1000.0000 mg | ORAL_TABLET | Freq: Three times a day (TID) | ORAL | Status: DC

## 2015-07-04 MED FILL — TRIAMCINOLONE 0.5% OINTMENT: 0.5 | 15 days supply | Qty: 30 | Fill #0

## 2015-07-04 MED FILL — XARELTO 20 MG TABLET: 20 | 30 days supply | Qty: 30 | Fill #0

## 2015-07-04 MED FILL — ?VALACYCLOVIR HCL 1 GRAM TA: 1 | 7 days supply | Qty: 21 | Fill #0

## 2015-07-04 NOTE — Telephone Encounter (Signed)
Routing to MD for approval

## 2015-07-04 NOTE — Telephone Encounter (Signed)
Attempted to contact patient back on the number he called on.  Woman answered and stated she did not know a Nicholas Glover.  The other two numbers are not working.

## 2015-07-04 NOTE — Telephone Encounter (Signed)
Refill sent to pharmacy; he needs an office visit

## 2015-07-17 ENCOUNTER — Ambulatory Visit: Payer: Self-pay | Admitting: Family Medicine

## 2015-08-02 ENCOUNTER — Ambulatory Visit: Payer: Self-pay | Admitting: Family Medicine

## 2015-09-21 MED FILL — XARELTO 20 MG TABLET: 20 | 30 days supply | Qty: 30 | Fill #1

## 2015-09-21 MED FILL — TRIAMCINOLONE 0.5% OINTMENT: 0.5 | 15 days supply | Qty: 30 | Fill #1

## 2015-10-03 ENCOUNTER — Ambulatory Visit: Payer: Self-pay

## 2015-10-03 ENCOUNTER — Telehealth: Payer: Self-pay | Admitting: Family Medicine

## 2015-10-03 MED ORDER — VALACYCLOVIR HCL 1 G PO TABS: 1000.0000 mg | ORAL_TABLET | Freq: Three times a day (TID) | ORAL | Status: DC

## 2015-10-03 NOTE — Telephone Encounter (Signed)
Done

## 2015-10-03 NOTE — Telephone Encounter (Signed)
Pt. Called stating that he has shingles and would like a refill on the medication that was  Prescribed to him. Pt does not know the name. Please f/u

## 2015-10-05 ENCOUNTER — Telehealth: Payer: Self-pay | Admitting: *Deleted

## 2015-10-05 NOTE — Telephone Encounter (Signed)
Patient verified DOB MA spoke with patient to verify why he was requesting a shingles vaccine. Patient was seen 12/30/14 with Dr. Venetia NightAMAO and treated with Valtrex. Patient had a refill on Valtrex on 10/03/15. Per Dr Hyman HopesJegede and review of patients encounter the shingles vaccine is not needed. Patient will finish dosage of Valtrex and then receive a prescription to take consistently. MA will contact patient tomorrow with the instructions from Dr. Hyman HopesJegede.

## 2015-10-06 ENCOUNTER — Ambulatory Visit: Payer: Self-pay

## 2016-01-03 ENCOUNTER — Other Ambulatory Visit: Payer: Self-pay | Admitting: Family Medicine

## 2016-01-03 DIAGNOSIS — D6859 Other primary thrombophilia: Secondary | ICD-10-CM

## 2016-01-05 ENCOUNTER — Other Ambulatory Visit: Payer: Self-pay | Admitting: Family Medicine

## 2016-01-05 MED FILL — TRIAMCINOLONE 0.5% OINTMENT: 0.5 | 15 days supply | Qty: 30 | Fill #0

## 2016-01-18 MED FILL — XARELTO 20 MG TABLET: 20 | 30 days supply | Qty: 30 | Fill #0

## 2016-08-29 ENCOUNTER — Other Ambulatory Visit: Payer: Self-pay | Admitting: Family Medicine

## 2016-08-29 DIAGNOSIS — D6859 Other primary thrombophilia: Secondary | ICD-10-CM

## 2016-09-02 MED FILL — TRIAMCINOLONE 0.5% OINTMENT: 0.5 | 15 days supply | Qty: 30 | Fill #0

## 2016-09-04 ENCOUNTER — Ambulatory Visit (HOSPITAL_COMMUNITY)
Admission: EM | Admit: 2016-09-04 | Discharge: 2016-09-04 | Disposition: A | Discharge status: Home / Self Care | Payer: Self-pay | Attending: Emergency Medicine | Admitting: Emergency Medicine

## 2016-09-04 ENCOUNTER — Encounter (HOSPITAL_COMMUNITY): Payer: Self-pay | Admitting: Family Medicine

## 2016-09-04 DIAGNOSIS — Z86711 Personal history of pulmonary embolism: Secondary | ICD-10-CM

## 2016-09-04 DIAGNOSIS — Z760 Encounter for issue of repeat prescription: Secondary | ICD-10-CM

## 2016-09-04 DIAGNOSIS — Z86718 Personal history of other venous thrombosis and embolism: Secondary | ICD-10-CM

## 2016-09-04 MED ORDER — RIVAROXABAN (XARELTO) VTE STARTER PACK (15 & 20 MG): ORAL_TABLET | ORAL | 0 refills | Status: DC

## 2016-09-04 MED FILL — **XARELTO 15 MG TABLET: 15 MG | 7 days supply | Qty: 14 | Fill #0

## 2016-09-04 NOTE — ED Triage Notes (Signed)
Pt here for refill on xarelto. Sts he doesn't have an appointment until 30 days with [facility]. sts he hasnt had the medication since November. Denies any chest pain, SOB, leg pain.

## 2016-09-04 NOTE — Discharge Instructions (Signed)
Take the Xarelto as directed. It has been 6 months and chew had the last one so you will be started on a starter pack. Be sure to follow-up and keep her appointment next month with continued health and wellness. Avoid activities that may calls trauma. Xarelto is a blood thinner and can cause bleeding with minor trauma especially with head injury. Bleeding to death is a potential side effect of this medication. Smoking increases your chance of blood clots substantially. Recommend stop smoking as soon as possible. There is a risk of taking medications prescribed by a health care provider without obtaining a complete history, labwork or proper physical exam, etc. This cannot be completed adequately at an urgent care. There can be multiple problems, some serious,  associated with medications and undetermined conditions of your health status. This action is performed as a last resort in order to supply you with medication. By receiving these prescriptions you are ackowleging and accepting these risks and will not hold the prescriber or any agent of [facility] as responsible for any adverse outcomes.
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
## 2016-09-05 ENCOUNTER — Other Ambulatory Visit: Payer: Self-pay | Admitting: *Deleted

## 2016-09-05 ENCOUNTER — Other Ambulatory Visit: Payer: Self-pay | Admitting: Family Medicine

## 2016-09-05 MED ORDER — RIVAROXABAN (XARELTO) VTE STARTER PACK (15 & 20 MG): ORAL_TABLET | ORAL | 0 refills | Status: DC

## 2016-09-05 MED ORDER — RIVAROXABAN 20 MG PO TABS: 20.0000 mg | ORAL_TABLET | Freq: Every day | ORAL | 3 refills | Status: DC

## 2016-09-06 IMAGING — CR DG CHEST 2V
2 series · 2 of 2 positions shown · non-contrast
Comparison: April 03, 2011

CLINICAL DATA: Intermittent chest pain. Lower extremity pain with
history of deep venous thrombosis.

EXAM:
CHEST  2 VIEW

[w chest pa]
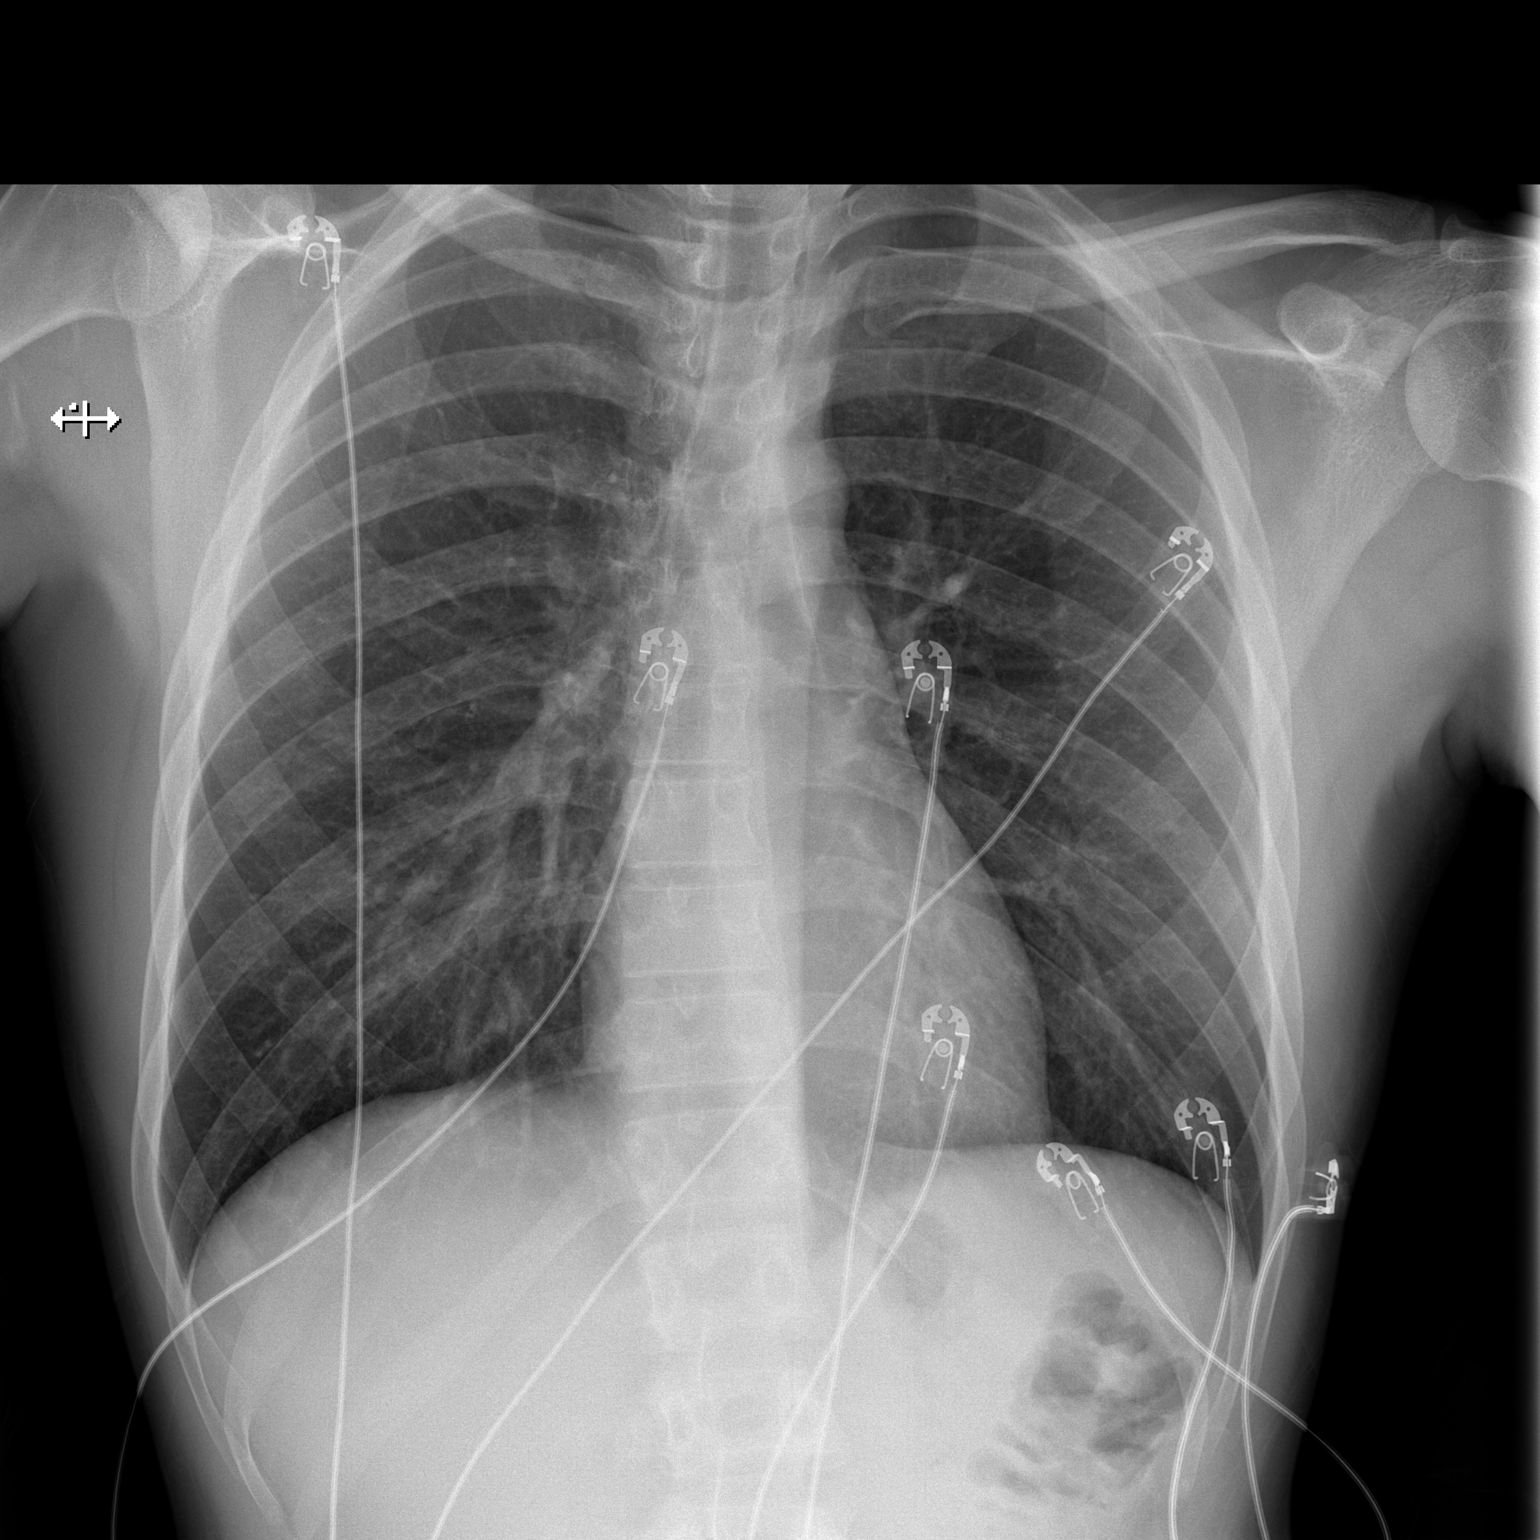

[w chest lat]
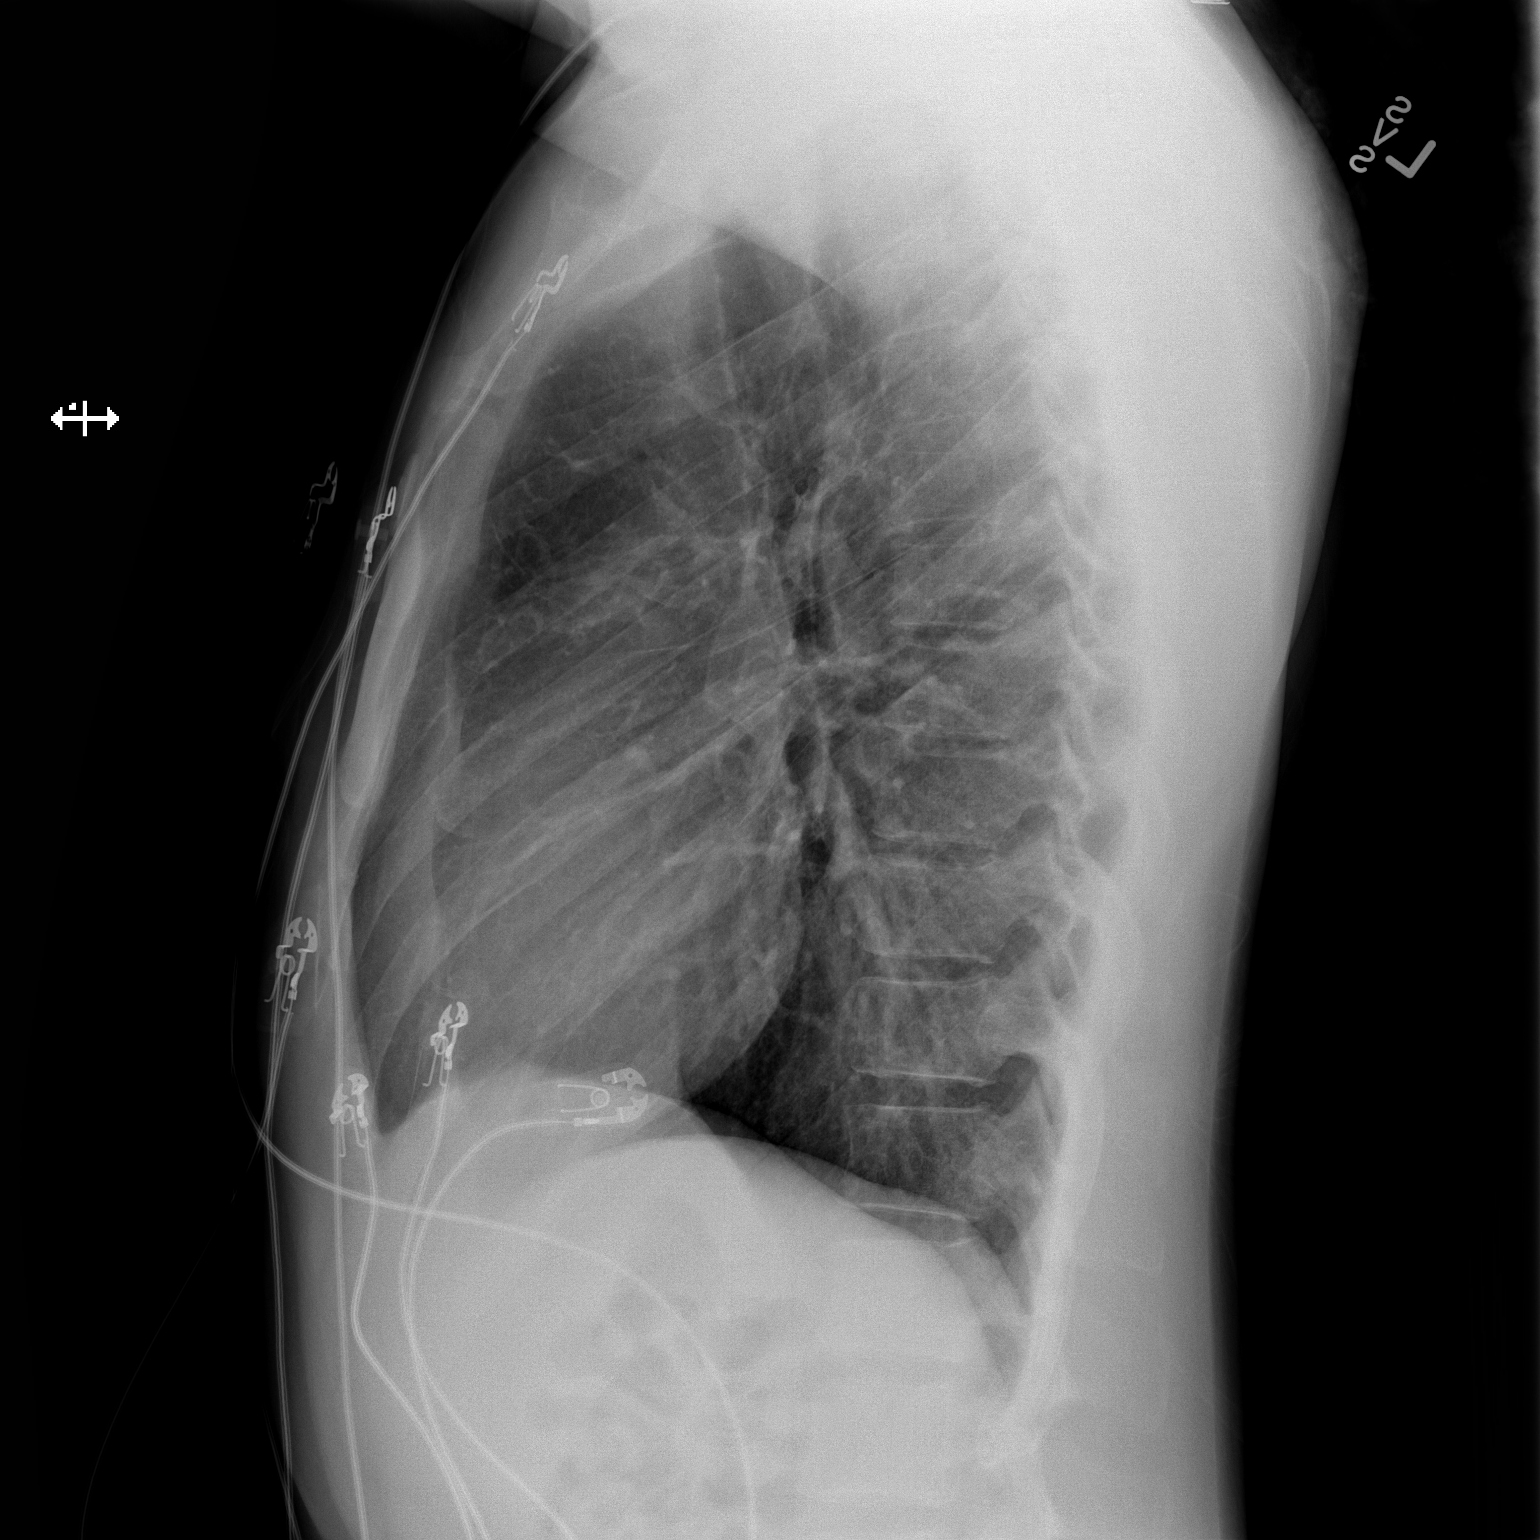

[2 of 2 positions shown; findings below may reference images not displayed]

FINDINGS: Lungs are clear. Heart size and pulmonary vascularity are normal. No
adenopathy. No pneumothorax. No bone lesions.
IMPRESSION: No abnormality noted.

## 2016-10-17 ENCOUNTER — Ambulatory Visit: Payer: Self-pay | Admitting: Family Medicine

## 2016-10-22 ENCOUNTER — Telehealth: Payer: Self-pay

## 2016-10-22 ENCOUNTER — Other Ambulatory Visit: Payer: Self-pay | Admitting: Family Medicine

## 2016-10-22 MED FILL — XARELTO 20 MG TABLET: 20 | 30 days supply | Qty: 30 | Fill #0

## 2016-10-22 NOTE — Telephone Encounter (Signed)
Dr Venetia NightAmao Pt requesting a refill of triamcinolone ointment (KENALOG) 0.5 % and also he said that the pharmacy mention a cream as well with the same name he wants to try both . Please, call him when is ready thank you .

## 2016-10-22 NOTE — Telephone Encounter (Signed)
Pt requesting a refill of triamcinolone ointment (KENALOG) 0.5 % and also he said that the pharmacy mention a cream as well with the same name he wants to try both . Please, call him when is ready thank you .

## 2016-10-23 NOTE — Telephone Encounter (Signed)
He has not had an office visit since 12/2014. We will discuss that when he comes in for an office visit.

## 2016-10-24 NOTE — Telephone Encounter (Signed)
CMA call regarding medication refill  Patient was aware and understood   

## 2017-03-04 ENCOUNTER — Other Ambulatory Visit: Payer: Self-pay | Admitting: Family Medicine

## 2017-03-07 ENCOUNTER — Other Ambulatory Visit: Payer: Self-pay | Admitting: Family Medicine

## 2017-03-07 MED FILL — XARELTO 20 MG TABLET: 20 | 30 days supply | Qty: 30 | Fill #0

## 2017-07-28 MED FILL — XARELTO 20 MG TABLET: 20 | 30 days supply | Qty: 30 | Fill #1

## 2017-09-01 ENCOUNTER — Ambulatory Visit: Payer: Self-pay | Admitting: Family Medicine

## 2018-04-08 ENCOUNTER — Emergency Department (HOSPITAL_COMMUNITY)
Admission: EM | Admit: 2018-04-08 | Discharge: 2018-04-08 | Disposition: A | Discharge status: Home / Self Care | Payer: Self-pay | Attending: Emergency Medicine | Admitting: Emergency Medicine

## 2018-04-08 ENCOUNTER — Encounter (HOSPITAL_COMMUNITY): Payer: Self-pay | Admitting: Emergency Medicine

## 2018-04-08 DIAGNOSIS — Z202 Contact with and (suspected) exposure to infections with a predominantly sexual mode of transmission: Secondary | ICD-10-CM | POA: Insufficient documentation

## 2018-04-08 DIAGNOSIS — Z86718 Personal history of other venous thrombosis and embolism: Secondary | ICD-10-CM | POA: Insufficient documentation

## 2018-04-08 DIAGNOSIS — F17210 Nicotine dependence, cigarettes, uncomplicated: Secondary | ICD-10-CM | POA: Insufficient documentation

## 2018-04-08 DIAGNOSIS — B3749 Other urogenital candidiasis: Secondary | ICD-10-CM | POA: Insufficient documentation

## 2018-04-08 DIAGNOSIS — Z86711 Personal history of pulmonary embolism: Secondary | ICD-10-CM | POA: Insufficient documentation

## 2018-04-08 LAB — URINALYSIS, ROUTINE W REFLEX MICROSCOPIC
Bilirubin Urine: NEGATIVE
GLUCOSE, UA: NEGATIVE mg/dL
HGB URINE DIPSTICK: NEGATIVE
Ketones, ur: NEGATIVE mg/dL
LEUKOCYTES UA: NEGATIVE
Nitrite: NEGATIVE
PH: 5 (ref 5.0–8.0)
Protein, ur: NEGATIVE mg/dL
SPECIFIC GRAVITY, URINE: 1.016 (ref 1.005–1.030)

## 2018-04-08 LAB — GC/CHLAMYDIA PROBE AMP (~~LOC~~) NOT AT ARMC
Chlamydia: NEGATIVE
Neisseria Gonorrhea: NEGATIVE

## 2018-04-08 LAB — HIV ANTIBODY (ROUTINE TESTING W REFLEX): HIV SCREEN 4TH GENERATION: NONREACTIVE

## 2018-04-08 LAB — RPR: RPR: NONREACTIVE

## 2018-04-08 MED ORDER — LIDOCAINE HCL (PF) 1 % IJ SOLN
INTRAMUSCULAR | Status: AC
  Administered 2018-04-08: 06:00:00
  Filled 2018-04-08: qty 5

## 2018-04-08 MED ORDER — AZITHROMYCIN 250 MG PO TABS
1000.0000 mg | ORAL_TABLET | Freq: Once | ORAL | Status: AC
  Administered 2018-04-08: 1000 mg via ORAL
  Filled 2018-04-08: qty 4

## 2018-04-08 MED ORDER — CEFTRIAXONE SODIUM 250 MG IJ SOLR
250.0000 mg | Freq: Once | INTRAMUSCULAR | Status: AC
  Administered 2018-04-08: 250 mg via INTRAMUSCULAR
  Filled 2018-04-08: qty 250

## 2018-04-08 MED ORDER — NYSTATIN 100000 UNIT/GM EX CREA
1.0000 | TOPICAL_CREAM | Freq: Four times a day (QID) | CUTANEOUS | 0 refills | Status: DC
Start: 2018-04-08 — End: 2018-04-14

## 2018-04-08 MED ORDER — METRONIDAZOLE 500 MG PO TABS
2000.0000 mg | ORAL_TABLET | Freq: Once | ORAL | Status: AC
  Administered 2018-04-08: 2000 mg via ORAL
  Filled 2018-04-08: qty 4

## 2018-04-08 MED FILL — NYSTATIN 100,000 UNIT/GM CR: 100000 | 15 days supply | Qty: 30 | Fill #0
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
## 2018-04-08 NOTE — ED Triage Notes (Signed)
Pt reports his GF told him she was an STD, he is also having penile discharge and a rash.

## 2018-04-08 NOTE — Discharge Instructions (Signed)
You have been evaluated for potential sexually transmitted disease.  Your test may take several days for result, however you have received antibiotic to treat for potential trichomonas, gonorrhea, or chlamydial infection.  Eat yogurt high in probiotic for the next to help decrease risk of developing antibiotic associated diarrhea.  Avoid sexual activity until your condition has full resolved.  Return if you have any concerns.  Apply Nystatin cream to affected area on your groin to decrease risk of infection.

## 2018-04-14 ENCOUNTER — Emergency Department (HOSPITAL_COMMUNITY)
Admission: EM | Admit: 2018-04-14 | Discharge: 2018-04-14 | Disposition: A | Discharge status: Home / Self Care | Payer: Self-pay | Attending: Emergency Medicine | Admitting: Emergency Medicine

## 2018-04-14 ENCOUNTER — Other Ambulatory Visit: Payer: Self-pay

## 2018-04-14 ENCOUNTER — Encounter (HOSPITAL_COMMUNITY): Payer: Self-pay | Admitting: Emergency Medicine

## 2018-04-14 DIAGNOSIS — B3749 Other urogenital candidiasis: Secondary | ICD-10-CM | POA: Insufficient documentation

## 2018-04-14 DIAGNOSIS — Z760 Encounter for issue of repeat prescription: Secondary | ICD-10-CM | POA: Insufficient documentation

## 2018-04-14 DIAGNOSIS — F17210 Nicotine dependence, cigarettes, uncomplicated: Secondary | ICD-10-CM | POA: Insufficient documentation

## 2018-04-14 MED ORDER — NYSTATIN 100000 UNIT/GM EX CREA: 1.0000 "application " | TOPICAL_CREAM | Freq: Four times a day (QID) | CUTANEOUS | 0 refills | Status: DC

## 2018-04-14 NOTE — ED Triage Notes (Signed)
States needs more nystatin cream , states has appointment but not till the 20

## 2018-04-14 NOTE — ED Notes (Signed)
Pt assured thT AMT OF MED THAT WAS RX IS TWICE WHAT HE HAD BEFORE AND WOULD BE ENOUGH TO LAST TILL HIS APP ON THE 20

## 2018-04-14 NOTE — Discharge Instructions (Signed)
Use nystatin cream as prescribed.  Use only a dime sized amount like it spread throughout the entire area at a time. Follow-up with your primary care doctor on the 20th as scheduled. Return to the emergency room with any new, worsening, concerning symptoms.
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
## 2018-04-15 MED FILL — NYSTATIN 100,000 UNIT/GM CR: 100000 | 5 days supply | Qty: 30 | Fill #0

## 2018-04-22 MED FILL — NYSTATIN 100,000 UNIT/GM CR: 100000 | 5 days supply | Qty: 30 | Fill #1

## 2018-04-24 ENCOUNTER — Encounter: Payer: Self-pay | Admitting: Family Medicine

## 2018-04-24 ENCOUNTER — Ambulatory Visit: Payer: Self-pay | Attending: Family Medicine | Admitting: Family Medicine

## 2018-04-24 VITALS — BP 118/73 | HR 81 | Temp 98.3°F | Ht 74.0 in | Wt 201.0 lb

## 2018-04-24 DIAGNOSIS — B369 Superficial mycosis, unspecified: Secondary | ICD-10-CM | POA: Insufficient documentation

## 2018-04-24 DIAGNOSIS — Z7901 Long term (current) use of anticoagulants: Secondary | ICD-10-CM | POA: Insufficient documentation

## 2018-04-24 DIAGNOSIS — B356 Tinea cruris: Secondary | ICD-10-CM

## 2018-04-24 DIAGNOSIS — D6859 Other primary thrombophilia: Secondary | ICD-10-CM

## 2018-04-24 DIAGNOSIS — Z86711 Personal history of pulmonary embolism: Secondary | ICD-10-CM | POA: Insufficient documentation

## 2018-04-24 DIAGNOSIS — F17210 Nicotine dependence, cigarettes, uncomplicated: Secondary | ICD-10-CM | POA: Insufficient documentation

## 2018-04-24 DIAGNOSIS — F17290 Nicotine dependence, other tobacco product, uncomplicated: Secondary | ICD-10-CM | POA: Insufficient documentation

## 2018-04-24 DIAGNOSIS — G8929 Other chronic pain: Secondary | ICD-10-CM | POA: Insufficient documentation

## 2018-04-24 DIAGNOSIS — M549 Dorsalgia, unspecified: Secondary | ICD-10-CM | POA: Insufficient documentation

## 2018-04-24 DIAGNOSIS — Z9119 Patient's noncompliance with other medical treatment and regimen: Secondary | ICD-10-CM | POA: Insufficient documentation

## 2018-04-24 DIAGNOSIS — T45516A Underdosing of anticoagulants, initial encounter: Secondary | ICD-10-CM | POA: Insufficient documentation

## 2018-04-24 DIAGNOSIS — Z86718 Personal history of other venous thrombosis and embolism: Secondary | ICD-10-CM | POA: Insufficient documentation

## 2018-04-24 LAB — POCT GLYCOSYLATED HEMOGLOBIN (HGB A1C): HbA1c, POC (prediabetic range): 5.5 % — AB (ref 5.7–6.4)

## 2018-04-24 LAB — GLUCOSE, POCT (MANUAL RESULT ENTRY): POC Glucose: 96 mg/dL (ref 70–99)

## 2018-04-24 MED ORDER — FLUCONAZOLE 100 MG PO TABS: 100.0000 mg | ORAL_TABLET | Freq: Every day | ORAL | 0 refills | Status: DC

## 2018-04-24 MED ORDER — RIVAROXABAN 20 MG PO TABS: 20.0000 mg | ORAL_TABLET | Freq: Every day | ORAL | 3 refills | Status: DC

## 2018-04-24 MED ORDER — NYSTATIN 100000 UNIT/GM EX CREA: 1.0000 "application " | TOPICAL_CREAM | Freq: Four times a day (QID) | CUTANEOUS | 1 refills | Status: DC

## 2018-04-24 MED FILL — FLUCONAZOLE 100 MG TABLET: 100 | 3 days supply | Qty: 3 | Fill #0

## 2018-04-24 MED FILL — XARELTO 20 MG TABLET: 20 | 30 days supply | Qty: 30 | Fill #0
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
## 2018-05-04 MED FILL — NYSTATIN 100,000 UNIT/GM CR: 100000 | 10 days supply | Qty: 60 | Fill #0

## 2018-05-25 ENCOUNTER — Encounter: Payer: Self-pay | Admitting: Family Medicine

## 2018-05-29 ENCOUNTER — Other Ambulatory Visit: Payer: Self-pay | Admitting: Family Medicine

## 2018-05-29 DIAGNOSIS — B356 Tinea cruris: Secondary | ICD-10-CM

## 2018-05-29 MED FILL — NYSTATIN 100,000 UNIT/GM CR: 100000 | 10 days supply | Qty: 60 | Fill #1

## 2018-05-29 NOTE — Telephone Encounter (Signed)
1) Medication(s) Requested (by name): Diflucan Mycostatin xarelto 2) Pharmacy of Choice:  [facility]

## 2018-05-29 NOTE — Telephone Encounter (Signed)
Pt had refill for xarelto available at the pharmacy, will route requests for fluconazole and nystatin to PCP.

## 2018-06-15 ENCOUNTER — Ambulatory Visit: Payer: Self-pay | Admitting: Critical Care Medicine

## 2018-06-24 ENCOUNTER — Other Ambulatory Visit: Payer: Self-pay | Admitting: Family Medicine

## 2018-06-24 DIAGNOSIS — B356 Tinea cruris: Secondary | ICD-10-CM

## 2018-07-14 ENCOUNTER — Other Ambulatory Visit: Payer: Self-pay | Admitting: Family Medicine

## 2018-07-14 DIAGNOSIS — B356 Tinea cruris: Secondary | ICD-10-CM

## 2018-07-14 NOTE — Telephone Encounter (Signed)
New Message   1) Medication(s) Requested (by name): nystatin cream (MYCOSTATIN)  2) Pharmacy of Choice: [facility]  3) Special Requests:   Approved medications will be sent to the pharmacy, we will reach out if there is an issue.  Requests made after 3pm may not be addressed until the following business day!  If a patient is unsure of the name of the medication(s) please note and ask patient to call back when they are able to provide all info, do not send to responsible party until all information is available!

## 2018-07-15 ENCOUNTER — Other Ambulatory Visit: Payer: Self-pay | Admitting: Nurse Practitioner

## 2018-07-15 DIAGNOSIS — B356 Tinea cruris: Secondary | ICD-10-CM

## 2018-07-15 MED ORDER — FLUCONAZOLE 100 MG PO TABS: 100.0000 mg | ORAL_TABLET | Freq: Every day | ORAL | 0 refills | Status: DC

## 2018-07-15 MED ORDER — NYSTATIN 100000 UNIT/GM EX CREA: 1.0000 "application " | TOPICAL_CREAM | Freq: Two times a day (BID) | CUTANEOUS | 0 refills | Status: AC

## 2018-07-15 MED FILL — NYSTATIN 100,000 UNIT/GM CR: 100000 | 10 days supply | Qty: 30 | Fill #0

## 2018-07-15 MED FILL — FLUCONAZOLE 100 MG TABLET: 100 | 3 days supply | Qty: 3 | Fill #0

## 2018-07-15 NOTE — Telephone Encounter (Signed)
RX approved for Nystatin cream to be applied twice daily for 10 days, 30 gm tube. Patient per chart with overuse of medication and complaint of recurrent rash that is likely non-fungal. Will discuss dermatology referral with patient at his upcoming appointment in April.

## 2018-08-12 ENCOUNTER — Ambulatory Visit: Payer: Self-pay | Admitting: Family Medicine

## 2018-08-13 ENCOUNTER — Ambulatory Visit: Payer: Self-pay | Attending: Primary Care | Admitting: Primary Care

## 2018-08-13 ENCOUNTER — Ambulatory Visit: Payer: Self-pay | Attending: Family Medicine | Admitting: Primary Care

## 2018-08-13 ENCOUNTER — Other Ambulatory Visit: Payer: Self-pay

## 2018-08-13 ENCOUNTER — Encounter: Payer: Self-pay | Admitting: Primary Care

## 2018-08-13 ENCOUNTER — Ambulatory Visit: Payer: Self-pay | Admitting: Family Medicine

## 2018-08-13 VITALS — BP 115/72 | HR 67 | Temp 98.7°F | Resp 16 | Wt 193.6 lb

## 2018-08-13 DIAGNOSIS — B356 Tinea cruris: Secondary | ICD-10-CM

## 2018-08-13 DIAGNOSIS — Z760 Encounter for issue of repeat prescription: Secondary | ICD-10-CM

## 2018-08-13 DIAGNOSIS — D6859 Other primary thrombophilia: Secondary | ICD-10-CM

## 2018-08-13 MED ORDER — TRIAMCINOLONE ACETONIDE 0.1 % EX CREA: 1.0000 "application " | TOPICAL_CREAM | Freq: Two times a day (BID) | CUTANEOUS | 0 refills | Status: DC

## 2018-08-13 MED ORDER — FLUCONAZOLE 100 MG PO TABS: 100.0000 mg | ORAL_TABLET | Freq: Every day | ORAL | 0 refills | Status: DC

## 2018-08-13 MED ORDER — RIVAROXABAN 20 MG PO TABS: 20.0000 mg | ORAL_TABLET | Freq: Every day | ORAL | 3 refills | Status: DC

## 2018-08-13 MED FILL — TRIAMCINOLONE ACETONIDE 0.1: 0.1 | 15 days supply | Qty: 30 | Fill #0

## 2018-08-13 MED FILL — FLUCONAZOLE 100 MG TABLET: 100 | 3 days supply | Qty: 3 | Fill #0

## 2018-08-13 MED FILL — XARELTO 20 MG TABLET: 20 | 30 days supply | Qty: 30 | Fill #0
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
## 2018-09-07 ENCOUNTER — Ambulatory Visit: Payer: Self-pay | Admitting: *Deleted

## 2018-09-07 NOTE — Telephone Encounter (Signed)
Pt reports drank drink from convenience store and bottom of cup was filled with ants. States he knows he ingested "A few." Episode occurred 1.5 hours ago. Pt is asymptomatic. Poison control called and relayed message from Thyra Breed' at center.Marland Kitchen"No concerns." Pt verbalizes understanding. Instructed pt to CB if symptoms arise. Poison control center # given.  Reason for Disposition . Nursing judgment  Protocols used: NO GUIDELINE OR REFERENCE AVAILABLE-A-AH

## 2018-10-15 ENCOUNTER — Other Ambulatory Visit: Payer: Self-pay | Admitting: Primary Care

## 2018-10-15 DIAGNOSIS — B356 Tinea cruris: Secondary | ICD-10-CM

## 2018-10-15 MED FILL — XARELTO 20 MG TABLET: 20 | 30 days supply | Qty: 30 | Fill #1

## 2018-10-16 MED FILL — TRIAMCINOLONE ACETONIDE 0.1: 0.1 | 7 days supply | Qty: 30 | Fill #0

## 2018-11-12 ENCOUNTER — Ambulatory Visit: Payer: Self-pay | Attending: Family Medicine | Admitting: Family Medicine

## 2018-11-12 ENCOUNTER — Other Ambulatory Visit: Payer: Self-pay

## 2018-11-12 DIAGNOSIS — Z91199 Patient's noncompliance with other medical treatment and regimen due to unspecified reason: Secondary | ICD-10-CM

## 2018-11-12 DIAGNOSIS — Z5329 Procedure and treatment not carried out because of patient's decision for other reasons: Secondary | ICD-10-CM

## 2018-11-25 ENCOUNTER — Ambulatory Visit (HOSPITAL_COMMUNITY)
Admission: EM | Admit: 2018-11-25 | Discharge: 2018-11-25 | Disposition: A | Discharge status: Home / Self Care | Payer: Self-pay

## 2018-11-26 ENCOUNTER — Encounter (HOSPITAL_COMMUNITY): Payer: Self-pay

## 2018-11-26 ENCOUNTER — Other Ambulatory Visit: Payer: Self-pay

## 2018-11-26 ENCOUNTER — Emergency Department (HOSPITAL_COMMUNITY): Payer: Self-pay

## 2018-11-26 ENCOUNTER — Emergency Department (HOSPITAL_COMMUNITY)
Admission: EM | Admit: 2018-11-26 | Discharge: 2018-11-26 | Disposition: A | Discharge status: Home / Self Care | Payer: Self-pay | Attending: Emergency Medicine | Admitting: Emergency Medicine

## 2018-11-26 DIAGNOSIS — L259 Unspecified contact dermatitis, unspecified cause: Secondary | ICD-10-CM | POA: Insufficient documentation

## 2018-11-26 DIAGNOSIS — R05 Cough: Secondary | ICD-10-CM | POA: Insufficient documentation

## 2018-11-26 DIAGNOSIS — R0602 Shortness of breath: Secondary | ICD-10-CM | POA: Insufficient documentation

## 2018-11-26 DIAGNOSIS — F17290 Nicotine dependence, other tobacco product, uncomplicated: Secondary | ICD-10-CM | POA: Insufficient documentation

## 2018-11-26 DIAGNOSIS — Z79899 Other long term (current) drug therapy: Secondary | ICD-10-CM | POA: Insufficient documentation

## 2018-11-26 DIAGNOSIS — Z7901 Long term (current) use of anticoagulants: Secondary | ICD-10-CM | POA: Insufficient documentation

## 2018-11-26 LAB — BASIC METABOLIC PANEL
Anion gap: 10 (ref 5–15)
BUN: 9 mg/dL (ref 6–20)
CO2: 24 mmol/L (ref 22–32)
Calcium: 9.3 mg/dL (ref 8.9–10.3)
Chloride: 104 mmol/L (ref 98–111)
Creatinine, Ser: 0.95 mg/dL (ref 0.61–1.24)
GFR calc Af Amer: >60 mL/min (ref >60)
GFR calc non Af Amer: >60 mL/min (ref >60)
Glucose, Bld: 99 mg/dL (ref 70–99)
Potassium: 4 mmol/L (ref 3.5–5.1)
Sodium: 138 mmol/L (ref 135–145)

## 2018-11-26 LAB — BRAIN NATRIURETIC PEPTIDE: B Natriuretic Peptide: 11.1 pg/mL (ref 0.0–100.0)

## 2018-11-26 MED ORDER — ALBUTEROL SULFATE HFA 108 (90 BASE) MCG/ACT IN AERS
2.0000 | INHALATION_SPRAY | RESPIRATORY_TRACT | Status: DC | PRN
  Administered 2018-11-26: 2 via RESPIRATORY_TRACT
  Filled 2018-11-26: qty 13.4

## 2018-11-26 MED ORDER — PREDNISONE 20 MG PO TABS: ORAL_TABLET | ORAL | 0 refills | Status: DC

## 2018-11-26 MED ORDER — SODIUM CHLORIDE (PF) 0.9 % IJ SOLN
INTRAMUSCULAR | Status: AC
  Administered 2018-11-26: 16:00:00
  Filled 2018-11-26: qty 50

## 2018-11-26 MED ORDER — IOHEXOL 350 MG/ML SOLN
100.0000 mL | Freq: Once | INTRAVENOUS | Status: AC | PRN
  Administered 2018-11-26: 100 mL via INTRAVENOUS

## 2018-11-26 MED ORDER — IOHEXOL 300 MG/ML  SOLN: 100.0000 mL | Freq: Once | INTRAMUSCULAR | Status: DC | PRN

## 2018-11-26 MED ORDER — HYDROXYZINE HCL 25 MG PO TABS: 25.0000 mg | ORAL_TABLET | Freq: Four times a day (QID) | ORAL | 0 refills | Status: DC

## 2018-11-26 MED FILL — hydrOXYzine HCL 25 MG TABS: 25 | 3 days supply | Qty: 12 | Fill #0

## 2018-11-26 NOTE — ED Provider Notes (Signed)
  Face-to-face evaluation   History: He c/o progressing rash right forearm and lower leg for several days. No known cause. Also c/o SOB, and is off Xarelto for several weeks. No CP or fever.  Physical exam: raised rash on right forearm and right lower leg; irregular , c/w Rhus dermatitis. Doubt Shingles. Lungs clear. No respiratory distress.  Medical screening examination/treatment/procedure(s) were conducted as a shared visit with non-physician practitioner(s) and myself.  I personally evaluated the patient during the encounter    Daleen Bo, MD 11/27/18 1654

## 2018-11-26 NOTE — ED Triage Notes (Signed)
Pt states that he has been short of breath for "years". Pt not dx with asthma. Pt does have hx of blood clots, but takes xarelto. Pt speaking in full sentences during and after walk from triage. Pt has an outbreak of shingles on right arm and leg. Not currently taking any medication at this time.
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
## 2018-11-26 NOTE — Discharge Instructions (Signed)
You have been evaluated for your shortness of breath.  Fortunately your CT scan did not show any evidence of blood clot in your lung or signs of pneumonia.  The rash that you have on your right forearm and right ankle is not related to shingle.  It is likely contact dermatitis.  Take prednisone as prescribed, take Vistaril for itch, and use albuterol inhaler 2 puffs every 4 hours as needed for shortness of breath.  Follow-up with your doctor for further care.

## 2018-11-26 NOTE — ED Notes (Signed)
Patient given discharge teaching and verbalized understanding. Patient ambulated out of ED with a steady gait. 

## 2018-11-26 NOTE — ED Notes (Signed)
EKG handed to Eulis Foster MD

## 2019-01-13 ENCOUNTER — Ambulatory Visit: Payer: Self-pay | Admitting: Family Medicine

## 2019-03-15 ENCOUNTER — Other Ambulatory Visit: Payer: Self-pay | Admitting: Family Medicine

## 2019-03-15 ENCOUNTER — Telehealth: Payer: Self-pay | Admitting: *Deleted

## 2019-03-15 DIAGNOSIS — B356 Tinea cruris: Secondary | ICD-10-CM

## 2019-03-15 MED FILL — XARELTO 20 MG TABLET: 20 | 30 days supply | Qty: 30 | Fill #2

## 2019-03-18 ENCOUNTER — Telehealth: Payer: Self-pay | Admitting: Family Medicine

## 2019-03-18 ENCOUNTER — Ambulatory Visit: Payer: Self-pay

## 2019-03-18 NOTE — Telephone Encounter (Signed)
Called the patient wrong number call the emergency contact and no answer

## 2019-03-18 NOTE — Telephone Encounter (Signed)
Patient came in 30 mins late to his appointment asking for a refill on his inhaler and triamcinolone cream (KENALOG) 0.1 % [[id number]]

## 2019-03-18 NOTE — Telephone Encounter (Signed)
He needs to be seen-hasn't been seen in our office since 04/2018.  Thanks!  Freeman Caldron

## 2019-03-22 NOTE — Telephone Encounter (Signed)
Made an appt

## 2019-04-14 ENCOUNTER — Ambulatory Visit: Payer: Self-pay | Admitting: Family Medicine

## 2019-07-25 ENCOUNTER — Other Ambulatory Visit: Payer: Self-pay

## 2019-07-25 ENCOUNTER — Emergency Department: Payer: No Typology Code available for payment source

## 2019-07-25 DIAGNOSIS — F17290 Nicotine dependence, other tobacco product, uncomplicated: Secondary | ICD-10-CM | POA: Diagnosis not present

## 2019-07-25 DIAGNOSIS — S161XXA Strain of muscle, fascia and tendon at neck level, initial encounter: Secondary | ICD-10-CM | POA: Insufficient documentation

## 2019-07-25 DIAGNOSIS — Y999 Unspecified external cause status: Secondary | ICD-10-CM | POA: Insufficient documentation

## 2019-07-25 DIAGNOSIS — Y92410 Unspecified street and highway as the place of occurrence of the external cause: Secondary | ICD-10-CM | POA: Insufficient documentation

## 2019-07-25 DIAGNOSIS — Z86718 Personal history of other venous thrombosis and embolism: Secondary | ICD-10-CM | POA: Insufficient documentation

## 2019-07-25 DIAGNOSIS — Y939 Activity, unspecified: Secondary | ICD-10-CM | POA: Insufficient documentation

## 2019-07-25 DIAGNOSIS — S199XXA Unspecified injury of neck, initial encounter: Secondary | ICD-10-CM | POA: Diagnosis present

## 2019-07-25 DIAGNOSIS — Z86711 Personal history of pulmonary embolism: Secondary | ICD-10-CM | POA: Insufficient documentation
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
## 2019-07-25 NOTE — ED Notes (Signed)
Patient called for triage, no answer. Not seen in lobby.

## 2019-07-26 ENCOUNTER — Emergency Department
Admission: EM | Admit: 2019-07-26 | Discharge: 2019-07-26 | Disposition: A | Discharge status: Home / Self Care | Payer: No Typology Code available for payment source | Attending: Emergency Medicine | Admitting: Emergency Medicine

## 2019-07-26 DIAGNOSIS — R52 Pain, unspecified: Secondary | ICD-10-CM

## 2019-07-26 DIAGNOSIS — T148XXA Other injury of unspecified body region, initial encounter: Secondary | ICD-10-CM

## 2019-07-26 NOTE — Discharge Instructions (Addendum)
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
## 2019-07-29 ENCOUNTER — Emergency Department
Admission: EM | Admit: 2019-07-29 | Discharge: 2019-07-29 | Disposition: A | Discharge status: Home / Self Care | Payer: No Typology Code available for payment source | Attending: Emergency Medicine | Admitting: Emergency Medicine

## 2019-07-29 ENCOUNTER — Emergency Department: Payer: No Typology Code available for payment source

## 2019-07-29 ENCOUNTER — Other Ambulatory Visit: Payer: Self-pay

## 2019-07-29 DIAGNOSIS — M25562 Pain in left knee: Secondary | ICD-10-CM | POA: Diagnosis not present

## 2019-07-29 DIAGNOSIS — M79605 Pain in left leg: Secondary | ICD-10-CM | POA: Insufficient documentation

## 2019-07-29 DIAGNOSIS — S39012D Strain of muscle, fascia and tendon of lower back, subsequent encounter: Secondary | ICD-10-CM | POA: Insufficient documentation

## 2019-07-29 DIAGNOSIS — F17290 Nicotine dependence, other tobacco product, uncomplicated: Secondary | ICD-10-CM | POA: Diagnosis not present

## 2019-07-29 DIAGNOSIS — Z79899 Other long term (current) drug therapy: Secondary | ICD-10-CM | POA: Diagnosis not present

## 2019-07-29 DIAGNOSIS — S39012A Strain of muscle, fascia and tendon of lower back, initial encounter: Secondary | ICD-10-CM

## 2019-07-29 DIAGNOSIS — S161XXD Strain of muscle, fascia and tendon at neck level, subsequent encounter: Secondary | ICD-10-CM | POA: Insufficient documentation

## 2019-07-29 DIAGNOSIS — S161XXA Strain of muscle, fascia and tendon at neck level, initial encounter: Secondary | ICD-10-CM

## 2019-07-29 LAB — URINALYSIS, COMPLETE (UACMP) WITH MICROSCOPIC
Bilirubin Urine: NEGATIVE
Glucose, UA: NEGATIVE mg/dL
Hgb urine dipstick: NEGATIVE
Ketones, ur: NEGATIVE mg/dL
Leukocytes,Ua: NEGATIVE
Nitrite: NEGATIVE
Protein, ur: NEGATIVE mg/dL
Specific Gravity, Urine: 1.025 (ref 1.005–1.030)
pH: 5 (ref 5.0–8.0)

## 2019-07-29 MED ORDER — HYDROCODONE-ACETAMINOPHEN 5-325 MG PO TABS
1.0000 | ORAL_TABLET | Freq: Once | ORAL | Status: AC
  Administered 2019-07-29: 05:00:00 1 via ORAL
  Filled 2019-07-29: qty 1

## 2019-07-29 MED ORDER — CYCLOBENZAPRINE HCL 10 MG PO TABS
5.0000 mg | ORAL_TABLET | Freq: Once | ORAL | Status: AC
  Administered 2019-07-29: 5 mg via ORAL
  Filled 2019-07-29: qty 1

## 2019-07-29 MED ORDER — CYCLOBENZAPRINE HCL 10 MG PO TABS: 10.0000 mg | ORAL_TABLET | Freq: Three times a day (TID) | ORAL | 0 refills | Status: DC | PRN

## 2019-07-29 MED ORDER — RIVAROXABAN 20 MG PO TABS: 20.0000 mg | ORAL_TABLET | Freq: Every day | ORAL | 0 refills | Status: DC

## 2019-07-29 MED ORDER — HYDROCODONE-ACETAMINOPHEN 5-325 MG PO TABS: 1.0000 | ORAL_TABLET | Freq: Four times a day (QID) | ORAL | 0 refills | Status: DC | PRN

## 2019-07-29 MED ORDER — CYCLOBENZAPRINE HCL 5 MG PO TABS: ORAL_TABLET | ORAL | 0 refills | Status: DC

## 2019-07-29 NOTE — ED Notes (Signed)
Per patient has history of blood clots.

## 2019-07-29 NOTE — ED Notes (Signed)
Pt alert and oriented X 4, stable for discharge. RR even and unlabored, color WNL. Discussed discharge instructions and follow up when appropriate. Instructed to follow up with ER for any life threatening symptoms or concerns that patient or family of patient may have  

## 2019-07-29 NOTE — ED Notes (Signed)
Pt taken to MRI  
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
## 2019-07-29 NOTE — Discharge Instructions (Addendum)
1.  You may continue to take ibuprofen as needed for pain.  You may take Norco as needed for more severe pain and Flexeril as needed for muscle spasms. 2.  Apply moist heat to neck and back several times daily. 3.  You may remove knee wrap to bathe and sleep.  Elevate left knee and apply ice several times daily. 4.  Restart your Xarelto 20 mg daily. 5.  Return to the ER for worsening symptoms, persistent vomiting, difficulty breathing or other concerns.

## 2019-07-29 NOTE — ED Notes (Signed)
E signature paper printed, signed and and placed in medical records.
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
## 2019-08-04 MED FILL — METHYLPREDNISOLONE 4 MG TBP: 4 | 6 days supply | Qty: 1 | Fill #0

## 2019-08-10 MED FILL — XARELTO 20 MG TABLET: 20 | 30 days supply | Qty: 30 | Fill #0
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
## 2019-08-11 ENCOUNTER — Telehealth: Payer: Self-pay

## 2019-08-11 ENCOUNTER — Ambulatory Visit: Payer: Self-pay | Attending: Critical Care Medicine | Admitting: Critical Care Medicine

## 2019-08-11 ENCOUNTER — Other Ambulatory Visit: Payer: Self-pay

## 2019-08-11 ENCOUNTER — Encounter: Payer: Self-pay | Admitting: Critical Care Medicine

## 2019-08-11 ENCOUNTER — Ambulatory Visit: Payer: Self-pay | Admitting: Licensed Clinical Social Worker

## 2019-08-11 VITALS — BP 148/73 | HR 86 | Temp 98.7°F | Ht 74.0 in | Wt 193.4 lb

## 2019-08-11 DIAGNOSIS — Z598 Other problems related to housing and economic circumstances: Secondary | ICD-10-CM

## 2019-08-11 DIAGNOSIS — M542 Cervicalgia: Secondary | ICD-10-CM

## 2019-08-11 DIAGNOSIS — I82409 Acute embolism and thrombosis of unspecified deep veins of unspecified lower extremity: Secondary | ICD-10-CM

## 2019-08-11 DIAGNOSIS — B356 Tinea cruris: Secondary | ICD-10-CM | POA: Insufficient documentation

## 2019-08-11 DIAGNOSIS — F329 Major depressive disorder, single episode, unspecified: Secondary | ICD-10-CM

## 2019-08-11 DIAGNOSIS — Z599 Problem related to housing and economic circumstances, unspecified: Secondary | ICD-10-CM

## 2019-08-11 DIAGNOSIS — D6859 Other primary thrombophilia: Secondary | ICD-10-CM

## 2019-08-11 DIAGNOSIS — F32A Depression, unspecified: Secondary | ICD-10-CM | POA: Insufficient documentation

## 2019-08-11 DIAGNOSIS — I2782 Chronic pulmonary embolism: Secondary | ICD-10-CM

## 2019-08-11 MED ORDER — MELOXICAM 15 MG PO TABS: 15.0000 mg | ORAL_TABLET | Freq: Every day | ORAL | 0 refills | Status: DC

## 2019-08-11 MED ORDER — LIDOCAINE 4 % EX PTCH: 1.0000 | MEDICATED_PATCH | Freq: Every day | CUTANEOUS | 0 refills | Status: DC

## 2019-08-11 MED ORDER — ALBUTEROL SULFATE HFA 108 (90 BASE) MCG/ACT IN AERS: 2.0000 | INHALATION_SPRAY | Freq: Four times a day (QID) | RESPIRATORY_TRACT | 0 refills | Status: AC | PRN

## 2019-08-11 MED ORDER — TRIAMCINOLONE ACETONIDE 0.1 % EX CREA: 1.0000 "application " | TOPICAL_CREAM | Freq: Two times a day (BID) | CUTANEOUS | 0 refills | Status: AC

## 2019-08-11 MED ORDER — CYCLOBENZAPRINE HCL 10 MG PO TABS: 10.0000 mg | ORAL_TABLET | Freq: Three times a day (TID) | ORAL | 4 refills | Status: DC | PRN

## 2019-08-11 MED ORDER — RIVAROXABAN 20 MG PO TABS: 20.0000 mg | ORAL_TABLET | Freq: Every day | ORAL | 4 refills | Status: AC

## 2019-08-11 MED ORDER — ACETAMINOPHEN 500 MG PO TABS: 500.0000 mg | ORAL_TABLET | Freq: Four times a day (QID) | ORAL | 2 refills | Status: DC | PRN

## 2019-08-11 MED ORDER — TERBINAFINE HCL 250 MG PO TABS: 250.0000 mg | ORAL_TABLET | Freq: Every day | ORAL | 0 refills | Status: AC

## 2019-08-11 MED FILL — TRIAMCINOLONE ACETONIDE 0.1: 0.1 | 15 days supply | Qty: 30 | Fill #0

## 2019-08-11 MED FILL — TERBINAFINE HCL 250 MG TAB: 250 | 14 days supply | Qty: 14 | Fill #0

## 2019-08-11 MED FILL — MELOXICAM 15 MG TABLET: 15 | 30 days supply | Qty: 30 | Fill #0

## 2019-08-11 MED FILL — CYCLOBENZAPRINE 10 MG TAB: 10 | 10 days supply | Qty: 30 | Fill #0

## 2019-08-11 MED FILL — ALBUTEROL SULFATE HFA 108 (: 108 (90 BAS | 25 days supply | Qty: 18 | Fill #0

## 2019-08-11 NOTE — Assessment & Plan Note (Signed)
As per DVT assessment

## 2019-08-11 NOTE — Telephone Encounter (Signed)
Met with the patient when he was in the clinic today.  He explained about legal troubles he is trying to deal with.  He said that some are criminal matters and some are civil matters.  He was in agreement to placing a referral with [employer] [facility] [place].  He said that he has contacted them in the past and they were not able to assist but he is willing to try again.  He signed the referral and it was faxed to [facility] attention [place].

## 2019-08-11 NOTE — Assessment & Plan Note (Signed)
Recurrent,  Will Rx terbinafine for 14days 250mg  daily
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
## 2019-08-11 NOTE — Assessment & Plan Note (Signed)
Hypercoaguable state with DVT LE recurrent   Stay on xarelto for life

## 2019-08-11 NOTE — Assessment & Plan Note (Signed)
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

## 2019-08-11 NOTE — Progress Notes (Signed)
Med refills,   Wants another antifungal cream for the spot b/t his leg from before

## 2019-08-11 NOTE — Patient Instructions (Addendum)
Start meloxicam daily for neck pain Do neck exercises below Use terbinafine daily for two weeks for rash in groin Use lidocaine patch on back of neck  Neck xrays ordered   Neck Exercises Ask your health care provider which exercises are safe for you. Do exercises exactly as told by your health care provider and adjust them as directed. It is normal to feel mild stretching, pulling, tightness, or discomfort as you do these exercises. Stop right away if you feel sudden pain or your pain gets worse. Do not begin these exercises until told by your health care provider. Neck exercises can be important for many reasons. They can improve strength and maintain flexibility in your neck, which will help your upper back and prevent neck pain. Stretching exercises Rotation neck stretching  1. Sit in a chair or stand up. 2. Place your feet flat on the floor, shoulder width apart. 3. Slowly turn your head (rotate) to the right until a slight stretch is felt. Turn it all the way to the right so you can look over your right shoulder. Do not tilt or tip your head. 4. Hold this position for 10-30 seconds. 5. Slowly turn your head (rotate) to the left until a slight stretch is felt. Turn it all the way to the left so you can look over your left shoulder. Do not tilt or tip your head. 6. Hold this position for 10-30 seconds. Repeat __________ times. Complete this exercise __________ times a day. Neck retraction 1. Sit in a sturdy chair or stand up. 2. Look straight ahead. Do not bend your neck. 3. Use your fingers to push your chin backward (retraction). Do not bend your neck for this movement. Continue to face straight ahead. If you are doing the exercise properly, you will feel a slight sensation in your throat and a stretch at the back of your neck. 4. Hold the stretch for 1-2 seconds. Repeat __________ times. Complete this exercise __________ times a day. Strengthening exercises Neck press 1. Lie on  your back on a firm bed or on the floor with a pillow under your head. 2. Use your neck muscles to push your head down on the pillow and straighten your spine. 3. Hold the position as well as you can. Keep your head facing up (in a neutral position) and your chin tucked. 4. Slowly count to 5 while holding this position. Repeat __________ times. Complete this exercise __________ times a day. Isometrics These are exercises in which you strengthen the muscles in your neck while keeping your neck still (isometrics). 1. Sit in a supportive chair and place your hand on your forehead. 2. Keep your head and face facing straight ahead. Do not flex or extend your neck while doing isometrics. 3. Push forward with your head and neck while pushing back with your hand. Hold for 10 seconds. 4. Do the sequence again, this time putting your hand against the back of your head. Use your head and neck to push backward against the hand pressure. 5. Finally, do the same exercise on either side of your head, pushing sideways against the pressure of your hand. Repeat __________ times. Complete this exercise __________ times a day. Prone head lifts 1. Lie face-down (prone position), resting on your elbows so that your chest and upper back are raised. 2. Start with your head facing downward, near your chest. Position your chin either on or near your chest. 3. Slowly lift your head upward. Lift until you are looking  straight ahead. Then continue lifting your head as far back as you can comfortably stretch. 4. Hold your head up for 5 seconds. Then slowly lower it to your starting position. Repeat __________ times. Complete this exercise __________ times a day. Supine head lifts 1. Lie on your back (supine position), bending your knees to point to the ceiling and keeping your feet flat on the floor. 2. Lift your head slowly off the floor, raising your chin toward your chest. 3. Hold for 5 seconds. Repeat __________ times.  Complete this exercise __________ times a day. Scapular retraction 1. Stand with your arms at your sides. Look straight ahead. 2. Slowly pull both shoulders (scapulae) backward and downward (retraction) until you feel a stretch between your shoulder blades in your upper back. 3. Hold for 10-30 seconds. 4. Relax and repeat. Repeat __________ times. Complete this exercise __________ times a day. Contact a health care provider if:  Your neck pain or discomfort gets much worse when you do an exercise.  Your neck pain or discomfort does not improve within 2 hours after you exercise. If you have any of these problems, stop exercising right away. Do not do the exercises again unless your health care provider says that you can. Get help right away if:  You develop sudden, severe neck pain. If this happens, stop exercising right away. Do not do the exercises again unless your health care provider says that you can. This information is not intended to replace advice given to you by your health care provider. Make sure you discuss any questions you have with your health care provider. Document Revised: 02/18/2018 Document Reviewed: 02/18/2018 Elsevier Patient Education  2020 [employer].

## 2019-08-12 ENCOUNTER — Encounter: Payer: Self-pay | Admitting: Critical Care Medicine

## 2019-08-16 ENCOUNTER — Other Ambulatory Visit: Payer: Self-pay

## 2019-08-16 ENCOUNTER — Ambulatory Visit (HOSPITAL_COMMUNITY)
Admission: RE | Admit: 2019-08-16 | Discharge: 2019-08-16 | Disposition: A | Discharge status: Home / Self Care | Payer: Self-pay | Source: Ambulatory Visit | Attending: Critical Care Medicine | Admitting: Critical Care Medicine

## 2019-08-16 DIAGNOSIS — M542 Cervicalgia: Secondary | ICD-10-CM | POA: Insufficient documentation

## 2019-08-18 MED FILL — CELECOXIB 200 MG CAPSULE: 200 | 30 days supply | Qty: 30 | Fill #0

## 2019-08-20 ENCOUNTER — Telehealth: Payer: Self-pay | Admitting: Family Medicine
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
## 2019-08-20 NOTE — Telephone Encounter (Signed)
Patient requested for a PT referral. Patient requested for [facility] to go to [facility]. Patient was given financial assistance information today will call Monday for an appointment. Please follow up at your earliest convenience.

## 2019-08-23 ENCOUNTER — Encounter: Payer: Self-pay | Admitting: *Deleted

## 2019-08-24 ENCOUNTER — Ambulatory Visit: Payer: Self-pay | Attending: Critical Care Medicine | Admitting: Critical Care Medicine

## 2019-08-24 ENCOUNTER — Encounter: Payer: Self-pay | Admitting: Critical Care Medicine

## 2019-08-24 ENCOUNTER — Telehealth: Payer: Self-pay | Admitting: Family Medicine

## 2019-08-24 ENCOUNTER — Other Ambulatory Visit: Payer: Self-pay

## 2019-08-24 ENCOUNTER — Encounter: Payer: Self-pay | Admitting: Family Medicine

## 2019-08-24 VITALS — BP 125/83 | HR 88 | Temp 97.9°F | Ht 74.0 in | Wt 193.8 lb

## 2019-08-24 DIAGNOSIS — M542 Cervicalgia: Secondary | ICD-10-CM

## 2019-08-24 DIAGNOSIS — R062 Wheezing: Secondary | ICD-10-CM

## 2019-08-24 DIAGNOSIS — I82409 Acute embolism and thrombosis of unspecified deep veins of unspecified lower extremity: Secondary | ICD-10-CM

## 2019-08-24 DIAGNOSIS — B356 Tinea cruris: Secondary | ICD-10-CM

## 2019-08-24 DIAGNOSIS — J4521 Mild intermittent asthma with (acute) exacerbation: Secondary | ICD-10-CM

## 2019-08-24 DIAGNOSIS — D6859 Other primary thrombophilia: Secondary | ICD-10-CM

## 2019-08-24 MED ORDER — SERTRALINE HCL 50 MG PO TABS: 50.0000 mg | ORAL_TABLET | Freq: Every day | ORAL | 3 refills | Status: DC

## 2019-08-24 MED ORDER — PREDNISONE 20 MG PO TABS: ORAL_TABLET | ORAL | 0 refills | Status: DC

## 2019-08-24 MED FILL — predniSONE 20 MG TABS: 20 | 5 days supply | Qty: 10 | Fill #0

## 2019-08-24 MED FILL — SERTRALINE HCL 50 MG TABLET: 50 | 30 days supply | Qty: 30 | Fill #0

## 2019-08-24 NOTE — Assessment & Plan Note (Signed)
Continue Xarelto for life 20 mg daily

## 2019-08-24 NOTE — Assessment & Plan Note (Signed)
Acute on chronic neck pain  Will refer to orthopedics for further evaluation continue cyclobenzaprine and meloxicam

## 2019-08-24 NOTE — Patient Instructions (Addendum)
Stay on albuterol inhaler two puff every 6 hours as needed  Stay on Meloxicam daily  Take another round of prednisone for the breathing:  20mg  take two daily for 5 days  Radiology asking for specialty referral before MRI is ordered, therefore a referral to orthopedics was made  Please focus on smoking cessation  A prescription for sertraline for anxiety was given  Return Dr in 4 months

## 2019-08-24 NOTE — Assessment & Plan Note (Signed)
Chronic hypercoagulable state with recurrent deep venous thrombosis and pulmonary embolism we will continue Xarelto 20 mg daily for life

## 2019-08-24 NOTE — Telephone Encounter (Signed)
Attempted to call the patient to inform the patient that his medical records the patient requested for was complete. The number that was provided at the time of the medical records request was a fax number I believe, It played the sound that plays when a fax is trying to go through.Attempted to call the patient and  received a busy tone.

## 2019-08-24 NOTE — Progress Notes (Signed)
Need to talk about his eating  Breathing  Talk about getting on something for anxiety  Per pt he is not sleeping due to his PTSD
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
## 2019-08-24 NOTE — Assessment & Plan Note (Signed)
We will begin prednisone 40 mg a day for 5 days continue albuterol as needed for now

## 2019-08-24 NOTE — Assessment & Plan Note (Signed)
Continue terbinafine to complete full course of therapy

## 2019-08-25 ENCOUNTER — Telehealth: Payer: Self-pay

## 2019-08-25 NOTE — Telephone Encounter (Signed)
As per Vernona Rieger [facility] [place], they have spoken to the patient and have worked to identify resources for him.

## 2019-09-10 ENCOUNTER — Other Ambulatory Visit: Payer: Self-pay

## 2019-09-10 ENCOUNTER — Ambulatory Visit (INDEPENDENT_AMBULATORY_CARE_PROVIDER_SITE_OTHER): Payer: 59 | Admitting: Family Medicine

## 2019-09-10 ENCOUNTER — Encounter: Payer: Self-pay | Admitting: Family Medicine

## 2019-09-10 DIAGNOSIS — M25532 Pain in left wrist: Secondary | ICD-10-CM

## 2019-09-10 DIAGNOSIS — M25512 Pain in left shoulder: Secondary | ICD-10-CM | POA: Diagnosis not present

## 2019-09-10 DIAGNOSIS — M25511 Pain in right shoulder: Secondary | ICD-10-CM

## 2019-09-10 DIAGNOSIS — M542 Cervicalgia: Secondary | ICD-10-CM | POA: Diagnosis not present

## 2019-09-10 DIAGNOSIS — M25562 Pain in left knee: Secondary | ICD-10-CM
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
## 2019-09-15 ENCOUNTER — Other Ambulatory Visit: Payer: Self-pay

## 2019-09-15 ENCOUNTER — Encounter: Payer: Self-pay | Admitting: Physical Therapy

## 2019-09-15 ENCOUNTER — Telehealth: Payer: Self-pay | Admitting: Family Medicine

## 2019-09-15 ENCOUNTER — Ambulatory Visit (INDEPENDENT_AMBULATORY_CARE_PROVIDER_SITE_OTHER): Payer: 59 | Admitting: Physical Therapy

## 2019-09-15 DIAGNOSIS — M25532 Pain in left wrist: Secondary | ICD-10-CM

## 2019-09-15 DIAGNOSIS — M542 Cervicalgia: Secondary | ICD-10-CM

## 2019-09-15 DIAGNOSIS — M25562 Pain in left knee: Secondary | ICD-10-CM

## 2019-09-15 DIAGNOSIS — M25512 Pain in left shoulder: Secondary | ICD-10-CM | POA: Diagnosis not present

## 2019-09-15 DIAGNOSIS — M25511 Pain in right shoulder: Secondary | ICD-10-CM
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
## 2019-09-15 NOTE — Patient Instructions (Signed)
Access Code: [email or web address]: [email or web address]: 05/12/2021Prepared by: Arlys John NelsonExercises  Supine Chin Tuck - 2 x daily - 6 x weekly - 2-3 sets - 10 reps  Seated Assisted Cervical Rotation with Towel - 2 x daily - 6 x weekly - 10 reps - 1 sets - 5 hold  Supine Shoulder Flexion Extension AAROM with Dowel - 2 x daily - 6 x weekly - 10 reps - 3 sets  Seated Scapular Retraction - 2 x daily - 6 x weekly - 10 reps - 2-3 sets - 5 sec hold  Seated Wrist Flexion and Extension with Towel Twist - 2 x daily - 6 x weekly - 10 reps - 3 sets  Supine Heel Slide with Strap - 2 x daily - 6 x weekly - 10 reps - 1-2 sets - 5 hold Patient Education  TENS Therapy

## 2019-09-15 NOTE — Telephone Encounter (Signed)
Patient is here for PT. Says he needs something for pain. His call back number is [phone]

## 2019-09-15 NOTE — Addendum Note (Signed)
Addended by: April Manson on: 09/15/2019 09:11 PM   Modules accepted: Orders

## 2019-09-16 ENCOUNTER — Encounter: Payer: Self-pay | Admitting: Physical Therapy

## 2019-09-16 ENCOUNTER — Ambulatory Visit (INDEPENDENT_AMBULATORY_CARE_PROVIDER_SITE_OTHER): Payer: 59 | Admitting: Physical Therapy

## 2019-09-16 DIAGNOSIS — M25511 Pain in right shoulder: Secondary | ICD-10-CM

## 2019-09-16 DIAGNOSIS — M25532 Pain in left wrist: Secondary | ICD-10-CM

## 2019-09-16 DIAGNOSIS — M542 Cervicalgia: Secondary | ICD-10-CM

## 2019-09-16 DIAGNOSIS — M25562 Pain in left knee: Secondary | ICD-10-CM

## 2019-09-16 DIAGNOSIS — M25512 Pain in left shoulder: Secondary | ICD-10-CM

## 2019-09-16 MED ORDER — DICLOFENAC SODIUM 75 MG PO TBEC: 75.0000 mg | DELAYED_RELEASE_TABLET | Freq: Two times a day (BID) | ORAL | 3 refills | Status: DC | PRN

## 2019-09-16 MED ORDER — BACLOFEN 10 MG PO TABS: 5.0000 mg | ORAL_TABLET | Freq: Three times a day (TID) | ORAL | 3 refills | Status: DC | PRN

## 2019-09-16 NOTE — Telephone Encounter (Signed)
Rxs sent

## 2019-09-16 NOTE — Telephone Encounter (Signed)
Is there anything else for him to try for pain?
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
## 2019-09-16 NOTE — Telephone Encounter (Signed)
I advised the patient of the new medications sent in, diclofenac (in pill form) and baclofen. I advised him to not take meloxicam nor celebrex while taking the diclofenac, and not to take cyclobenzaprine while taking baclofen.   The patient asks if he could get an Rx for diclofenac in gel form, as well. Please advise.

## 2019-09-16 NOTE — Addendum Note (Signed)
Addended by: Lillia Carmel on: 09/16/2019 11:14 AM   Modules accepted: Orders

## 2019-09-17 MED ORDER — DICLOFENAC SODIUM 1 % EX GEL: 4.0000 g | Freq: Four times a day (QID) | CUTANEOUS | 6 refills | Status: AC | PRN

## 2019-09-17 NOTE — Addendum Note (Signed)
Addended by: Lillia Carmel on: 09/17/2019 07:59 AM   Modules accepted: Orders

## 2019-09-17 NOTE — Telephone Encounter (Signed)
I called and advised the patient that the diclofenac gel has been sent in to his pharmacy and that Dr. Prince Rome suggests he take that instead of the oral form of diclofenac since he also takes xarelto. I did advise him to check with his PCP (who prescribes the xarelto) for extra guidance on that.

## 2019-09-17 NOTE — Telephone Encounter (Signed)
Actually, I'd forgotten that he is on xarelto for blood clot prevention.  He should probably not take diclofenac pills.  So I called in the gel.

## 2019-09-18 ENCOUNTER — Ambulatory Visit (INDEPENDENT_AMBULATORY_CARE_PROVIDER_SITE_OTHER)
Admission: RE | Admit: 2019-09-18 | Discharge: 2019-09-18 | Disposition: A | Discharge status: Home / Self Care | Payer: 59 | Source: Ambulatory Visit

## 2019-09-18 DIAGNOSIS — B356 Tinea cruris: Secondary | ICD-10-CM

## 2019-09-18 MED ORDER — CEPHALEXIN 500 MG PO CAPS: 500.0000 mg | ORAL_CAPSULE | Freq: Four times a day (QID) | ORAL | 0 refills | Status: DC

## 2019-09-18 MED ORDER — CLOTRIMAZOLE POWD: 1.0000 "application " | Freq: Two times a day (BID) | 0 refills | Status: DC

## 2019-09-18 MED ORDER — FLUCONAZOLE 200 MG PO TABS: 200.0000 mg | ORAL_TABLET | ORAL | 0 refills | Status: AC
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
## 2019-09-18 NOTE — Discharge Instructions (Signed)
Treating you for a fungal infection and bacterial infection of the groin area Take the medication as prescribed.  Please follow-up in person for any continued or worsening problems

## 2019-09-29 ENCOUNTER — Telehealth: Payer: Self-pay

## 2019-09-29 NOTE — Telephone Encounter (Signed)
As per Nicholas Glover/[employer] [place], they were unable to assist the patient with his issues. He has applied for unemployment but had not received a decision yet.  They told him that if he was denied, they would be able to assist him with the denial.

## 2019-10-05 MED FILL — XARELTO 20 MG TABLET: 20 | 30 days supply | Qty: 30 | Fill #0

## 2019-10-05 MED FILL — BACLOFEN 10 MG TABLET: 10 | 10 days supply | Qty: 30 | Fill #1

## 2019-10-06 ENCOUNTER — Encounter: Payer: 59 | Admitting: Physical Therapy

## 2019-10-08 ENCOUNTER — Ambulatory Visit: Payer: 59 | Admitting: Family Medicine

## 2019-10-11 ENCOUNTER — Encounter: Payer: 59 | Admitting: Physical Therapy

## 2019-10-14 ENCOUNTER — Ambulatory Visit: Payer: 59 | Attending: Family Medicine | Admitting: Physician Assistant

## 2019-10-14 ENCOUNTER — Other Ambulatory Visit: Payer: Self-pay

## 2019-10-14 VITALS — BP 114/71 | HR 97 | Temp 98.1°F | Resp 16 | Ht 72.0 in | Wt 188.0 lb

## 2019-10-14 DIAGNOSIS — I82409 Acute embolism and thrombosis of unspecified deep veins of unspecified lower extremity: Secondary | ICD-10-CM | POA: Diagnosis not present

## 2019-10-14 DIAGNOSIS — Z1331 Encounter for screening for depression: Secondary | ICD-10-CM | POA: Diagnosis not present

## 2019-10-14 DIAGNOSIS — Z09 Encounter for follow-up examination after completed treatment for conditions other than malignant neoplasm: Secondary | ICD-10-CM

## 2019-10-14 DIAGNOSIS — B356 Tinea cruris: Secondary | ICD-10-CM

## 2019-10-14 MED ORDER — KETOCONAZOLE 200 MG PO TABS: 200.0000 mg | ORAL_TABLET | Freq: Every day | ORAL | 0 refills | Status: DC

## 2019-10-14 MED ORDER — CLOTRIMAZOLE-BETAMETHASONE 1-0.05 % EX CREA: 1.0000 "application " | TOPICAL_CREAM | Freq: Two times a day (BID) | CUTANEOUS | 0 refills | Status: DC
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
## 2019-10-14 NOTE — Progress Notes (Signed)
Describes rash as itchy, painful and skin breakdown in the groin area for more than year despite prescribed meds treatment. Would like to be referred to a specialist     Made aware of depression and anxiety screening

## 2019-10-15 LAB — COMPREHENSIVE METABOLIC PANEL
ALT: 12 IU/L (ref 0–44)
AST: 16 IU/L (ref 0–40)
Albumin/Globulin Ratio: 1.6 (ref 1.2–2.2)
Albumin: 4.2 g/dL (ref 4.0–5.0)
Alkaline Phosphatase: 51 IU/L (ref 48–121)
BUN/Creatinine Ratio: 8 — ABNORMAL LOW (ref 9–20)
BUN: 9 mg/dL (ref 6–20)
Bilirubin Total: 0.5 mg/dL (ref 0.0–1.2)
CO2: 25 mmol/L (ref 20–29)
Calcium: 9.6 mg/dL (ref 8.7–10.2)
Chloride: 102 mmol/L (ref 96–106)
Creatinine, Ser: 1.06 mg/dL (ref 0.76–1.27)
GFR calc Af Amer: 106 mL/min/{1.73_m2} (ref >59)
GFR calc non Af Amer: 92 mL/min/{1.73_m2} (ref >59)
Globulin, Total: 2.6 g/dL (ref 1.5–4.5)
Glucose: 116 mg/dL — ABNORMAL HIGH (ref 65–99)
Potassium: 4.2 mmol/L (ref 3.5–5.2)
Sodium: 140 mmol/L (ref 134–144)
Total Protein: 6.8 g/dL (ref 6.0–8.5)

## 2019-10-15 LAB — CBC WITH DIFFERENTIAL/PLATELET
Basophils Absolute: 0 10*3/uL (ref 0.0–0.2)
Basos: 0 %
EOS (ABSOLUTE): 0.1 10*3/uL (ref 0.0–0.4)
Eos: 1 %
Hematocrit: 46.8 % (ref 37.5–51.0)
Hemoglobin: 16.1 g/dL (ref 13.0–17.7)
Immature Grans (Abs): 0 10*3/uL (ref 0.0–0.1)
Immature Granulocytes: 0 %
Lymphocytes Absolute: 2 10*3/uL (ref 0.7–3.1)
Lymphs: 41 %
MCH: 33 pg (ref 26.6–33.0)
MCHC: 34.4 g/dL (ref 31.5–35.7)
MCV: 96 fL (ref 79–97)
Monocytes Absolute: 0.5 10*3/uL (ref 0.1–0.9)
Monocytes: 10 %
Neutrophils Absolute: 2.3 10*3/uL (ref 1.4–7.0)
Neutrophils: 48 %
Platelets: 188 10*3/uL (ref 150–450)
RBC: 4.88 x10E6/uL (ref 4.14–5.80)
RDW: 12.3 % (ref 11.6–15.4)
WBC: 5 10*3/uL (ref 3.4–10.8)

## 2019-10-18 ENCOUNTER — Other Ambulatory Visit: Payer: Self-pay

## 2019-10-18 ENCOUNTER — Encounter: Payer: Self-pay | Admitting: Family Medicine

## 2019-10-18 ENCOUNTER — Ambulatory Visit (INDEPENDENT_AMBULATORY_CARE_PROVIDER_SITE_OTHER): Payer: 59 | Admitting: Family Medicine

## 2019-10-18 DIAGNOSIS — M25512 Pain in left shoulder: Secondary | ICD-10-CM | POA: Diagnosis not present

## 2019-10-18 DIAGNOSIS — M25511 Pain in right shoulder: Secondary | ICD-10-CM

## 2019-10-18 DIAGNOSIS — M542 Cervicalgia: Secondary | ICD-10-CM

## 2019-10-18 DIAGNOSIS — M25562 Pain in left knee: Secondary | ICD-10-CM

## 2019-10-18 DIAGNOSIS — M25532 Pain in left wrist: Secondary | ICD-10-CM

## 2019-10-18 NOTE — Progress Notes (Signed)
Left knee/wrist/neck followup. States the pain is better but still very much there. Having numbness and tingling down this left arm to his fingers. Wrist still very weak when trying to grasp/grip anything. States knee is popping a lot.
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
## 2019-11-04 DIAGNOSIS — M4802 Spinal stenosis, cervical region: Secondary | ICD-10-CM

## 2019-11-04 DIAGNOSIS — S83207A Unspecified tear of unspecified meniscus, current injury, left knee, initial encounter: Secondary | ICD-10-CM

## 2019-11-04 DIAGNOSIS — M24232 Disorder of ligament, left wrist: Secondary | ICD-10-CM

## 2019-11-04 HISTORY — DX: Unspecified tear of unspecified meniscus, current injury, left knee, initial encounter: S83.207A

## 2019-11-04 HISTORY — DX: Disorder of ligament, left wrist: M24.232

## 2019-11-04 HISTORY — DX: Spinal stenosis, cervical region: M48.02

## 2019-11-10 ENCOUNTER — Telehealth: Payer: Self-pay | Admitting: Family Medicine

## 2019-11-10 ENCOUNTER — Other Ambulatory Visit: Payer: Self-pay | Admitting: Physician Assistant

## 2019-11-10 DIAGNOSIS — B356 Tinea cruris: Secondary | ICD-10-CM

## 2019-11-10 MED FILL — BACLOFEN 10 MG TABLET: 10 | 10 days supply | Qty: 30 | Fill #2

## 2019-11-10 MED FILL — DICLOFENAC SODIUM 1% GEL: 1 | 30 days supply | Qty: 500 | Fill #1

## 2019-11-10 MED FILL — DICLOFENAC SOD EC 75 MG TAB: 75 | 30 days supply | Qty: 60 | Fill #1

## 2019-11-10 NOTE — Telephone Encounter (Signed)
Requested medication (s) are due for refill today - unknown  Requested medication (s) are on the active medication list -yes  Future visit scheduled -no  Last refill: 10/14/19  Notes to clinic: Request for RF of medication not assigned to protocol  Requested Prescriptions  Pending Prescriptions Disp Refills   clotrimazole-betamethasone (LOTRISONE) cream [Pharmacy Med Name: CLOTRIMAZOLE-BETAMETHASONE 1-0.05 Cream] 30 g 0    Sig: Apply 1 application topically 2 (two) times daily.      Off-Protocol Failed - 11/10/2019  3:37 PM      Failed - Medication not assigned to a protocol, review manually.      Passed - Valid encounter within last 12 months    Recent Outpatient Visits           3 weeks ago Tinea cruris   [facility] [place], [place], [place]   2 months ago Mild intermittent asthma with acute exacerbation   [facility] Storm Frisk, MD   3 months ago Acute neck pain   [facility] Storm Frisk, MD   1 year ago PRIMARY HYPERCOAGULABLE STATE   [facility] [place], Kinnie Scales, NP   1 year ago PRIMARY HYPERCOAGULABLE STATE   [facility] Cain Saupe, MD                  Requested Prescriptions  Pending Prescriptions Disp Refills   clotrimazole-betamethasone (LOTRISONE) cream [Pharmacy Med Name: CLOTRIMAZOLE-BETAMETHASONE 1-0.05 Cream] 30 g 0    Sig: Apply 1 application topically 2 (two) times daily.      Off-Protocol Failed - 11/10/2019  3:37 PM      Failed - Medication not assigned to a protocol, review manually.      Passed - Valid encounter within last 12 months    Recent Outpatient Visits           3 weeks ago Tinea cruris   [facility] [place], [place], [place]   2 months ago Mild intermittent asthma with acute exacerbation   [facility] Storm Frisk, MD   3 months ago Acute neck pain   [facility] Storm Frisk, MD   1 year ago PRIMARY HYPERCOAGULABLE STATE   [facility] [place], Kinnie Scales, NP   1 year ago PRIMARY HYPERCOAGULABLE STATE   [facility] Cain Saupe, MD

## 2019-11-10 NOTE — Telephone Encounter (Signed)
Please Advise.  Copied from CRM [id number]. Topic: General - Other >> Nov 10, 2019  3:16 PM Lyn Hollingshead D wrote: Reason for CRM: PT has questions about changing his medication / please advise

## 2019-11-12 MED FILL — CLOTRIMAZOLE-BETAMETHASONE: 1-0.05 | 15 days supply | Qty: 30 | Fill #0

## 2019-11-18 ENCOUNTER — Telehealth: Payer: Self-pay | Admitting: Licensed Clinical Social Worker

## 2019-11-18 NOTE — Telephone Encounter (Signed)
Call placed to patient regarding [facility] referral. Ongoing busy signal, LCSW was unable to leave a message to request a return call.

## 2019-11-23 ENCOUNTER — Other Ambulatory Visit: Payer: 59

## 2019-11-23 ENCOUNTER — Ambulatory Visit
Admission: RE | Admit: 2019-11-23 | Discharge: 2019-11-23 | Disposition: A | Discharge status: Home / Self Care | Payer: 59 | Source: Ambulatory Visit | Attending: Family Medicine | Admitting: Family Medicine

## 2019-11-23 ENCOUNTER — Other Ambulatory Visit: Payer: Self-pay

## 2019-11-23 DIAGNOSIS — M25562 Pain in left knee: Secondary | ICD-10-CM

## 2019-11-23 DIAGNOSIS — M542 Cervicalgia: Secondary | ICD-10-CM

## 2019-11-23 DIAGNOSIS — M25532 Pain in left wrist: Secondary | ICD-10-CM

## 2019-11-24 ENCOUNTER — Telehealth: Payer: Self-pay | Admitting: Family Medicine

## 2019-11-24 DIAGNOSIS — M5020 Other cervical disc displacement, unspecified cervical region: Secondary | ICD-10-CM

## 2019-11-24 DIAGNOSIS — M25532 Pain in left wrist: Secondary | ICD-10-CM

## 2019-11-24 DIAGNOSIS — M542 Cervicalgia: Secondary | ICD-10-CM

## 2019-11-24 NOTE — Telephone Encounter (Signed)
I looked at scan.  I think that arthroscopy and debridement within a month is okay.  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

## 2019-11-24 NOTE — Telephone Encounter (Signed)
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  This will likely need surgery.  I will request consultation with hand surgeon.  Left knee MRI shows a medial meniscus tear.  This will also likely need surgery.  I will request referral to one of our surgeons for this.  Neck MRI shows multiple disc problems, most significant at C5-6 level.  I will request referral to neurosurgeon for evaluation of this.

## 2019-11-24 NOTE — Telephone Encounter (Signed)
See below. Can you please review scan and see how quickly you feel we need to see patient?

## 2019-11-25 ENCOUNTER — Telehealth: Payer: Self-pay | Admitting: Family Medicine

## 2019-11-25 ENCOUNTER — Encounter (HOSPITAL_COMMUNITY): Payer: Self-pay

## 2019-11-25 ENCOUNTER — Emergency Department (HOSPITAL_COMMUNITY)
Admission: EM | Admit: 2019-11-25 | Discharge: 2019-11-25 | Disposition: A | Discharge status: Home / Self Care | Payer: 59 | Attending: Emergency Medicine | Admitting: Emergency Medicine

## 2019-11-25 DIAGNOSIS — Z5321 Procedure and treatment not carried out due to patient leaving prior to being seen by health care provider: Secondary | ICD-10-CM | POA: Diagnosis not present

## 2019-11-25 DIAGNOSIS — M79662 Pain in left lower leg: Secondary | ICD-10-CM | POA: Insufficient documentation

## 2019-11-25 NOTE — ED Notes (Signed)
Patient stated he no longer wanted to be seen and has left.

## 2019-11-25 NOTE — ED Triage Notes (Addendum)
Pt arrives to ED w/ c/o 10/10 LLE calf pain. Pt states he has hx of blood clots, states this feels similar. Pt denies injury trauma.

## 2019-11-25 NOTE — Telephone Encounter (Signed)
Appt scheduled

## 2019-11-25 NOTE — Telephone Encounter (Signed)
Patient called asked if he can get something a little stronger than Diclofenac. Patient said the Voltaren Gel might work a little better if he had something stronger to take. The number to contact patient is [phone]

## 2019-11-26 ENCOUNTER — Encounter: Payer: Self-pay | Admitting: Family Medicine

## 2019-11-26 ENCOUNTER — Telehealth: Payer: Self-pay | Admitting: Orthopedic Surgery

## 2019-11-26 ENCOUNTER — Telehealth: Payer: Self-pay | Admitting: Family Medicine

## 2019-11-26 ENCOUNTER — Telehealth: Payer: Self-pay

## 2019-11-26 ENCOUNTER — Telehealth: Payer: Self-pay | Admitting: *Deleted

## 2019-11-26 ENCOUNTER — Ambulatory Visit (HOSPITAL_COMMUNITY)
Admission: RE | Admit: 2019-11-26 | Discharge: 2019-11-26 | Disposition: A | Discharge status: Home / Self Care | Payer: 59 | Source: Ambulatory Visit | Attending: Orthopedic Surgery | Admitting: Orthopedic Surgery

## 2019-11-26 ENCOUNTER — Ambulatory Visit (INDEPENDENT_AMBULATORY_CARE_PROVIDER_SITE_OTHER): Payer: 59 | Admitting: Orthopedic Surgery

## 2019-11-26 ENCOUNTER — Ambulatory Visit (HOSPITAL_COMMUNITY)
Admission: EM | Admit: 2019-11-26 | Discharge: 2019-11-26 | Disposition: A | Discharge status: Home / Self Care | Payer: 59 | Attending: Family Medicine | Admitting: Family Medicine

## 2019-11-26 ENCOUNTER — Other Ambulatory Visit: Payer: Self-pay

## 2019-11-26 DIAGNOSIS — M79662 Pain in left lower leg: Secondary | ICD-10-CM | POA: Insufficient documentation

## 2019-11-26 DIAGNOSIS — F411 Generalized anxiety disorder: Secondary | ICD-10-CM

## 2019-11-26 DIAGNOSIS — S83242A Other tear of medial meniscus, current injury, left knee, initial encounter: Secondary | ICD-10-CM

## 2019-11-26 MED ORDER — ACETAMINOPHEN-CODEINE #3 300-30 MG PO TABS: 1.0000 | ORAL_TABLET | Freq: Every day | ORAL | 0 refills | Status: DC | PRN

## 2019-11-26 MED ORDER — HYDROXYZINE HCL 25 MG PO TABS: 25.0000 mg | ORAL_TABLET | ORAL | 0 refills | Status: DC | PRN

## 2019-11-26 MED ORDER — BUSPIRONE HCL 10 MG PO TABS: 10.0000 mg | ORAL_TABLET | Freq: Three times a day (TID) | ORAL | 0 refills | Status: DC

## 2019-11-26 MED ORDER — HYDROXYZINE HCL 25 MG PO TABS: 25.0000 mg | ORAL_TABLET | ORAL | 0 refills | Status: AC | PRN

## 2019-11-26 MED ORDER — ALPRAZOLAM 0.5 MG PO TABS: 0.5000 mg | ORAL_TABLET | Freq: Two times a day (BID) | ORAL | 0 refills | Status: AC | PRN

## 2019-11-26 MED FILL — ACETAMINOPHEN-COD #3 TABLET: 300-30 | 7 days supply | Qty: 7 | Fill #0

## 2019-11-26 MED FILL — hydrOXYzine HCL 25 MG TABS: 25 | 2 days supply | Qty: 20 | Fill #0

## 2019-11-26 MED FILL — busPIRone HCL 10 MG TABS: 10 | 10 days supply | Qty: 30 | Fill #0

## 2019-11-26 MED FILL — ALPRAZolam 0.5 MG TABS: 0.5 | 1 days supply | Qty: 2 | Fill #0
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
## 2019-11-26 NOTE — Discharge Instructions (Signed)
Follow-up with your primary care office The alprazolam is to take now.  Take 1, an hour if it is not working you may take a second 1 Tomorrow you can try BuSpar 3 times a day for anxiety You may also try hydroxyzine as needed for anxiety
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
## 2019-11-26 NOTE — Telephone Encounter (Signed)
Has appt today with Dr August Saucer. Can discuss at that time.
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
## 2019-11-26 NOTE — Telephone Encounter (Signed)
Please schedule a telemedicine video visit next week with the pt
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
## 2019-11-26 NOTE — Telephone Encounter (Signed)
Unable to reach pt on multiple tries / call does not go trough / pt is currently seen at [facility] / will schedule a phone visit next week

## 2019-11-26 NOTE — ED Triage Notes (Signed)
Pt requesting something for anxiety, states zoloft is not working. Pt refused to allow RN to take vitals, is being aggressive toward staff. Security notified

## 2019-11-26 NOTE — Telephone Encounter (Signed)
Pt would like rx sent to [facility] on [address]
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
## 2019-11-26 NOTE — Progress Notes (Signed)
Lower extremity venous has been completed.   Preliminary results in CV Proc.   Blanch Media 11/26/2019 4:15 PM
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
## 2019-11-26 NOTE — Telephone Encounter (Signed)
Pt is scheduled today for US doppler at [facility] at 4:oopm, pt is aware fo appt

## 2019-11-26 NOTE — Telephone Encounter (Signed)
Patient came into the office today to inform that the medication for anxiety is not work. Patient would like a different medication prescribed. Patient seen Dr.Wright 08/24/2019 and a Zoloft script was prescribed.

## 2019-11-26 NOTE — ED Notes (Signed)
Security notified and now at bedside.

## 2019-11-27 ENCOUNTER — Encounter: Payer: Self-pay | Admitting: Orthopedic Surgery
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
## 2019-11-29 ENCOUNTER — Other Ambulatory Visit: Payer: Self-pay | Admitting: Surgical

## 2019-11-29 NOTE — Telephone Encounter (Signed)
This is a GD pt 

## 2019-11-29 NOTE — Telephone Encounter (Signed)
PDMP looks like he picked up his RX from [facility]. Can you call to verify tomorrow when they open? Closed as of right now

## 2019-11-29 NOTE — Telephone Encounter (Signed)
Please advise. Thanks.  

## 2019-11-30 ENCOUNTER — Telehealth: Payer: Self-pay | Admitting: Surgical

## 2019-11-30 ENCOUNTER — Ambulatory Visit: Payer: 59 | Attending: Critical Care Medicine | Admitting: Critical Care Medicine

## 2019-11-30 ENCOUNTER — Other Ambulatory Visit: Payer: Self-pay | Admitting: Surgical

## 2019-11-30 DIAGNOSIS — F329 Major depressive disorder, single episode, unspecified: Secondary | ICD-10-CM

## 2019-11-30 MED ORDER — DICLOFENAC SODIUM 75 MG PO TBEC: 75.0000 mg | DELAYED_RELEASE_TABLET | Freq: Two times a day (BID) | ORAL | 1 refills | Status: DC | PRN
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
## 2019-11-30 NOTE — Telephone Encounter (Signed)
Patient called. He would like a refill on Voltaren. His call back number is [phone]. He did pick up other medication but they did not have the RX  For the cream.

## 2019-11-30 NOTE — Telephone Encounter (Signed)
Submitted

## 2019-11-30 NOTE — Telephone Encounter (Signed)
I spoke with patient. [facility] has rx ready for him to pick up. He will go there to pick up rx.

## 2019-11-30 NOTE — Progress Notes (Signed)
Patient ID: Nicholas Glover, male   DOB: 07-30-1985, 34 y.o.   MRN: [id number] The pt was a no show

## 2019-11-30 NOTE — Telephone Encounter (Signed)
Please advise 

## 2019-12-01 ENCOUNTER — Telehealth: Payer: Self-pay | Admitting: Licensed Clinical Social Worker

## 2019-12-01 NOTE — Telephone Encounter (Signed)
Call placed to patient regarding [facility] referral. Busy signal prevented LCSW from leaving a message.

## 2019-12-01 NOTE — Telephone Encounter (Signed)
I called patient and advised. 

## 2019-12-03 ENCOUNTER — Ambulatory Visit (INDEPENDENT_AMBULATORY_CARE_PROVIDER_SITE_OTHER): Payer: 59 | Admitting: Surgical

## 2019-12-03 DIAGNOSIS — S83242A Other tear of medial meniscus, current injury, left knee, initial encounter: Secondary | ICD-10-CM | POA: Diagnosis not present

## 2019-12-04 ENCOUNTER — Encounter: Payer: Self-pay | Admitting: Surgical
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
## 2019-12-15 NOTE — Pre-Procedure Instructions (Signed)
[facility] - [place], [place] - [address] [place] [address] Phone: [phone] Fax: [phone]  [facility] - [place], [place] - [address] [place] [address] Phone: [phone] Fax: [phone]  [facility] [address] [place]), [place] - [address] [place] [address] Phone: [phone] Fax: [phone]  [facility] - [place], [place] - [address] [facility] [place] [address] Phone: [phone] Fax: [phone]    Your procedure is scheduled on Mon., Aug, 16, 2021 from 11:01AM-12:23PM  Report to [facility] Entrance "A" at 9:00AM  Call this number if you have problems the morning of surgery:  [phone]   Remember:  Do not eat after midnight on Aug. 15th  You may drink clear liquids until 3 hours (8:00AM) prior to surgery time .  Clear liquids allowed are:  Water, Juice (non-citric and without pulp - diabetics please choose diet or no sugar options), Carbonated beverages - (diabetics please choose diet or no sugar options), Clear Tea, Black Coffee only (no creamer, milk or cream including half and half), Plain Jell-O only (diabetics please choose diet or no sugar options), Gatorade (diabetics please choose diet or no sugar options) and Plain Popsicles only   Enhanced Recovery after Surgery for Orthopedics Enhanced Recovery after Surgery is a protocol used to improve the stress on your body and your recovery after surgery.    The day of surgery (if you do NOT have diabetes):  o Drink ONE (1) Pre-Surgery Clear Ensure by __8:00AM___ am the morning of surgery   o This drink was given to you during your hospital  pre-op appointment visit. o Nothing else to drink after completing the  Pre-Surgery Clear Ensure.         If you have questions, please contact your  surgeons office.     Take these medicines the morning of surgery with A SIP OF WATER:     If Needed: Acetaminophen-codeine (TYLENOL #3)      ALPRAZolam (XANAX)  Albuterol (VENTOLIN HFA) Inhaler- bring with you day of surgery  Take last dose of Xarelto on 12/17/19, per Dr. Delford Field  As of today, STOP taking all Aspirin (unless instructed by your doctor) and Other Aspirin containing products, Vitamins, Fish oils, and Herbal medications. Also stop all NSAIDS i.e. Advil, Ibuprofen, Motrin, Aleve, Anaprox, Naproxen, BC, Goody Powders, and all Supplements. Including: Diclofenac (VOLTAREN)  No Smoking of any kind, Tobacco/Vaping, or Alcohol products 24 hours prior to your procedure. If you use a Cpap at night, you may bring all equipment for your overnight stay.   Special instructions:  [facility]- Preparing For Surgery  Before surgery, you can play an important role. Because skin is not sterile, your skin needs to be as free of germs as possible. You can reduce the number of germs on your skin by washing with CHG (chlorahexidine gluconate) Soap before surgery.  CHG is an antiseptic cleaner which kills germs and bonds with the skin to continue killing germs even after washing.    Please do not use if you have an allergy to CHG or antibacterial soaps. If your skin becomes reddened/irritated stop using the CHG.  Do not shave (including legs and underarms) for at least 48 hours prior to first CHG shower. It is OK to shave your face.  Please follow these instructions carefully.   1. Shower the  NIGHT BEFORE SURGERY and the MORNING OF SURGERY with CHG.   2. If you chose to wash your hair, wash your hair first as usual with your normal shampoo.  3. After you shampoo, rinse your hair and body thoroughly to remove the shampoo.  4. Use CHG as you would any other liquid soap. You can apply CHG directly to the skin and wash gently with a scrungie or a clean washcloth.   5. Apply the CHG Soap to your  body ONLY FROM THE NECK DOWN.  Do not use on open wounds or open sores. Avoid contact with your eyes, ears, mouth and genitals (private parts). Wash Face and genitals (private parts)  with your normal soap.  6. Wash thoroughly, paying special attention to the area where your surgery will be performed.  7. Thoroughly rinse your body with warm water from the neck down.  8. DO NOT shower/wash with your normal soap after using and rinsing off the CHG Soap.  9. Pat yourself dry with a CLEAN TOWEL.  10. Wear CLEAN PAJAMAS to bed the night before surgery, wear comfortable clothes the morning of surgery  11. Place CLEAN SHEETS on your bed the night of your first shower and DO NOT SLEEP WITH PETS.   Day of Surgery:             Remember to brush your teeth WITH YOUR REGULAR TOOTHPASTE.  Do not wear jewelry.  Do not wear lotions, powders, colognes, or deodorant.  Do not shave 48 hours prior to surgery.  Men may shave face and neck.  Do not bring valuables to the hospital.  [facility] is not responsible for any belongings or valuables.  Contacts, dentures or bridgework may not be worn into surgery.   For patients admitted to the hospital, discharge time will be determined by your treatment team.  Patients discharged the day of surgery will not be allowed to drive home, and someone age 29 and over needs to stay with them for 24 hours.   Please wear clean clothes to the hospital/surgery center.    Please read over the following fact sheets that you were given.

## 2019-12-16 ENCOUNTER — Encounter (HOSPITAL_BASED_OUTPATIENT_CLINIC_OR_DEPARTMENT_OTHER): Payer: Self-pay | Admitting: Orthopedic Surgery

## 2019-12-16 ENCOUNTER — Other Ambulatory Visit (HOSPITAL_COMMUNITY)
Admission: RE | Admit: 2019-12-16 | Discharge: 2019-12-16 | Disposition: A | Discharge status: Home / Self Care | Payer: 59 | Source: Ambulatory Visit | Attending: Orthopedic Surgery | Admitting: Orthopedic Surgery

## 2019-12-16 ENCOUNTER — Other Ambulatory Visit: Payer: Self-pay

## 2019-12-16 ENCOUNTER — Encounter (HOSPITAL_COMMUNITY)
Admission: RE | Admit: 2019-12-16 | Discharge: 2019-12-16 | Disposition: A | Discharge status: Home / Self Care | Payer: 59 | Source: Ambulatory Visit | Attending: Orthopedic Surgery | Admitting: Orthopedic Surgery

## 2019-12-16 DIAGNOSIS — Z20822 Contact with and (suspected) exposure to covid-19: Secondary | ICD-10-CM | POA: Insufficient documentation

## 2019-12-16 DIAGNOSIS — Z01812 Encounter for preprocedural laboratory examination: Secondary | ICD-10-CM | POA: Diagnosis present

## 2019-12-16 LAB — SARS CORONAVIRUS 2 (TAT 6-24 HRS): SARS Coronavirus 2: NEGATIVE

## 2019-12-16 MED FILL — DICLOFENAC SODIUM 1 % GEL: 1 | 30 days supply | Qty: 500 | Fill #2

## 2019-12-16 MED FILL — ACETAMINOPHEN-COD #3 TABLET: 300-30 | 7 days supply | Qty: 7 | Fill #1

## 2019-12-16 NOTE — Progress Notes (Addendum)
ADDENDUM:  Received pt pcp clearance , Dr Delford Field, via fax from Dr August Saucer office and placed w/ chart.  Spoke w/ via phone for pre-op interview--- PT Lab needs dos---- CBC, BMP              Lab results------ no COVID test ------ 12-16-2019 @ 1000 Arrive at ------- 0945 NPO after MN NO Solid Food.  Clear liquids from MN until--- 0845 Medications to take morning of surgery ----- Zoloft and if needed may take one type anxiety medication rx w/ sips of water Diabetic medication ----- n/a Patient Special Instructions ----- asked to bring rescue inhaler dos Pre-Op special Istructions ----- requested clearance for xarelto from Dr August Saucer office to be faxed. Pt aware needs name and number of caregiver when he checks in [place], he was unsure whom it would be today. Patient verbalized understanding of instructions that were given at this phone interview. Patient denies shortness of breath, chest pain, fever, cough at this phone interview.   PCP:  Dr Delford Field (lov 08-24-2019 epic)  Chest x-ray :  Chest CT 11-26-2018 epic EKG :  11-26-2018 epic Echo :  no Stress test:  no Cardiac Cath :   no Activity level:  No issues Sleep Study/ CPAP :  NO Fasting Blood Sugar :      / Checks Blood Sugar -- times a day:   N/A Blood Thinner/ Instructions /Last Dose: Xarelto/  Per pt was given instructions from Dr August Saucer office to stop 3 days prior to surgery/  Pt stated last dose Friday 12-17-2019 ASA / Instructions/ Last Dose :   NO

## 2019-12-17 NOTE — Progress Notes (Signed)
Pt's surgery with Dr. August Saucer previously scheduled at [facility] has been moved to [facility]. Pt's pre-op appt also cancelled by the surgeon. The pt never received any information regarding the surgery at [facility]. Please disregard the previous pre-op note.

## 2019-12-17 NOTE — Progress Notes (Signed)
Pt called back after message left about new arrival time.  Pt verbalized understanding to arrive at 0900 on 12-20-2019 for his surgery.

## 2019-12-17 NOTE — Progress Notes (Signed)
Called [name] to clarify preop instructions documented 12/16/19@ 2 pm.  [name] stated case was moved to [facility]. Pt  Given instructions by D. Henderson Newcomer, RN @ [facility].

## 2019-12-20 ENCOUNTER — Telehealth: Payer: Self-pay | Admitting: Orthopedic Surgery

## 2019-12-20 ENCOUNTER — Encounter (HOSPITAL_BASED_OUTPATIENT_CLINIC_OR_DEPARTMENT_OTHER): Payer: Self-pay | Admitting: Orthopedic Surgery

## 2019-12-20 ENCOUNTER — Other Ambulatory Visit: Payer: Self-pay

## 2019-12-20 ENCOUNTER — Ambulatory Visit (HOSPITAL_BASED_OUTPATIENT_CLINIC_OR_DEPARTMENT_OTHER): Payer: 59 | Admitting: Certified Registered"

## 2019-12-20 ENCOUNTER — Ambulatory Visit (HOSPITAL_BASED_OUTPATIENT_CLINIC_OR_DEPARTMENT_OTHER)
Admission: RE | Admit: 2019-12-20 | Discharge: 2019-12-20 | Disposition: A | Discharge status: Home / Self Care | Payer: 59 | Attending: Orthopedic Surgery | Admitting: Orthopedic Surgery

## 2019-12-20 ENCOUNTER — Encounter (HOSPITAL_BASED_OUTPATIENT_CLINIC_OR_DEPARTMENT_OTHER)
Admission: RE | Disposition: A | Discharge status: Home / Self Care | Payer: Self-pay | Source: Home / Self Care | Attending: Orthopedic Surgery

## 2019-12-20 DIAGNOSIS — Z86711 Personal history of pulmonary embolism: Secondary | ICD-10-CM | POA: Insufficient documentation

## 2019-12-20 DIAGNOSIS — Z7901 Long term (current) use of anticoagulants: Secondary | ICD-10-CM | POA: Insufficient documentation

## 2019-12-20 DIAGNOSIS — S83242A Other tear of medial meniscus, current injury, left knee, initial encounter: Secondary | ICD-10-CM | POA: Diagnosis present

## 2019-12-20 DIAGNOSIS — J4520 Mild intermittent asthma, uncomplicated: Secondary | ICD-10-CM | POA: Diagnosis not present

## 2019-12-20 DIAGNOSIS — Z79899 Other long term (current) drug therapy: Secondary | ICD-10-CM | POA: Diagnosis not present

## 2019-12-20 DIAGNOSIS — S83282A Other tear of lateral meniscus, current injury, left knee, initial encounter: Secondary | ICD-10-CM | POA: Insufficient documentation

## 2019-12-20 DIAGNOSIS — X58XXXA Exposure to other specified factors, initial encounter: Secondary | ICD-10-CM | POA: Insufficient documentation

## 2019-12-20 DIAGNOSIS — Z86718 Personal history of other venous thrombosis and embolism: Secondary | ICD-10-CM | POA: Diagnosis not present

## 2019-12-20 DIAGNOSIS — S83242D Other tear of medial meniscus, current injury, left knee, subsequent encounter: Secondary | ICD-10-CM | POA: Diagnosis not present

## 2019-12-20 DIAGNOSIS — F419 Anxiety disorder, unspecified: Secondary | ICD-10-CM | POA: Diagnosis not present

## 2019-12-20 HISTORY — DX: Presence of spectacles and contact lenses: Z97.3

## 2019-12-20 HISTORY — DX: Personal history of other mental and behavioral disorders: Z86.59

## 2019-12-20 HISTORY — PX: KNEE ARTHROSCOPY: SHX127

## 2019-12-20 HISTORY — DX: Mild intermittent asthma, uncomplicated: J45.20

## 2019-12-20 HISTORY — DX: Generalized anxiety disorder: F41.1

## 2019-12-20 HISTORY — DX: Personal history of other venous thrombosis and embolism: Z86.718

## 2019-12-20 LAB — CBC
HCT: 46.8 % (ref 39.0–52.0)
Hemoglobin: 15.7 g/dL (ref 13.0–17.0)
MCH: 33.5 pg (ref 26.0–34.0)
MCHC: 33.5 g/dL (ref 30.0–36.0)
MCV: 99.8 fL (ref 80.0–100.0)
Platelets: 205 10*3/uL (ref 150–400)
RBC: 4.69 MIL/uL (ref 4.22–5.81)
RDW: 14.1 % (ref 11.5–15.5)
WBC: 5.1 10*3/uL (ref 4.0–10.5)
nRBC: 0 % (ref 0.0–0.2)

## 2019-12-20 LAB — BASIC METABOLIC PANEL
Anion gap: 7 (ref 5–15)
BUN: 6 mg/dL (ref 6–20)
CO2: 27 mmol/L (ref 22–32)
Calcium: 8.7 mg/dL — ABNORMAL LOW (ref 8.9–10.3)
Chloride: 105 mmol/L (ref 98–111)
Creatinine, Ser: 0.83 mg/dL (ref 0.61–1.24)
GFR calc Af Amer: >60 mL/min (ref >60)
GFR calc non Af Amer: >60 mL/min (ref >60)
Glucose, Bld: 95 mg/dL (ref 70–99)
Potassium: 3.8 mmol/L (ref 3.5–5.1)
Sodium: 139 mmol/L (ref 135–145)

## 2019-12-20 SURGERY — ARTHROSCOPY, KNEE
Anesthesia: General | Site: Knee | Laterality: Left

## 2019-12-20 MED ORDER — OXYCODONE-ACETAMINOPHEN 5-325 MG PO TABS: 1.0000 | ORAL_TABLET | ORAL | 0 refills | Status: DC | PRN

## 2019-12-20 MED ORDER — HYDROMORPHONE HCL 1 MG/ML IJ SOLN
INTRAMUSCULAR | Status: AC
  Filled 2019-12-20: qty 1

## 2019-12-20 MED ORDER — HYDROMORPHONE HCL 1 MG/ML IJ SOLN
0.2500 mg | INTRAMUSCULAR | Status: DC | PRN
  Administered 2019-12-20: 0.25 mg via INTRAVENOUS

## 2019-12-20 MED ORDER — LACTATED RINGERS IV SOLN: INTRAVENOUS | Status: DC

## 2019-12-20 MED ORDER — POVIDONE-IODINE 7.5 % EX SOLN
Freq: Once | CUTANEOUS | Status: DC
  Filled 2019-12-20: qty 118

## 2019-12-20 MED ORDER — DEXAMETHASONE SODIUM PHOSPHATE 10 MG/ML IJ SOLN
INTRAMUSCULAR | Status: AC
  Filled 2019-12-20: qty 1

## 2019-12-20 MED ORDER — ACETAMINOPHEN 500 MG PO TABS
1000.0000 mg | ORAL_TABLET | Freq: Once | ORAL | Status: AC
  Administered 2019-12-20: 1000 mg via ORAL

## 2019-12-20 MED ORDER — MIDAZOLAM HCL 5 MG/5ML IJ SOLN
INTRAMUSCULAR | Status: DC | PRN
  Administered 2019-12-20: 2 mg via INTRAVENOUS

## 2019-12-20 MED ORDER — BUPIVACAINE-EPINEPHRINE 0.25% -1:200000 IJ SOLN
INTRAMUSCULAR | Status: DC | PRN
  Administered 2019-12-20: 20 mL
  Administered 2019-12-20: 10 mL

## 2019-12-20 MED ORDER — ACETAMINOPHEN 500 MG PO TABS
ORAL_TABLET | ORAL | Status: AC
  Filled 2019-12-20: qty 2

## 2019-12-20 MED ORDER — SODIUM CHLORIDE 0.9 % IR SOLN: Status: DC | PRN

## 2019-12-20 MED ORDER — LIDOCAINE 2% (20 MG/ML) 5 ML SYRINGE
INTRAMUSCULAR | Status: DC | PRN
  Administered 2019-12-20: 40 mg via INTRAVENOUS

## 2019-12-20 MED ORDER — PROMETHAZINE HCL 25 MG/ML IJ SOLN: 6.2500 mg | INTRAMUSCULAR | Status: DC | PRN

## 2019-12-20 MED ORDER — CEFAZOLIN SODIUM-DEXTROSE 2-4 GM/100ML-% IV SOLN
2.0000 g | INTRAVENOUS | Status: AC
  Administered 2019-12-20: 2 g via INTRAVENOUS

## 2019-12-20 MED ORDER — DEXAMETHASONE SODIUM PHOSPHATE 10 MG/ML IJ SOLN
INTRAMUSCULAR | Status: DC | PRN
  Administered 2019-12-20: 10 mg via INTRAVENOUS

## 2019-12-20 MED ORDER — CEFAZOLIN SODIUM-DEXTROSE 2-4 GM/100ML-% IV SOLN
INTRAVENOUS | Status: AC
  Filled 2019-12-20: qty 100

## 2019-12-20 MED ORDER — FENTANYL CITRATE (PF) 100 MCG/2ML IJ SOLN
INTRAMUSCULAR | Status: DC | PRN
  Administered 2019-12-20: 50 ug via INTRAVENOUS
  Administered 2019-12-20 (×2): 25 ug via INTRAVENOUS
  Administered 2019-12-20: 50 ug via INTRAVENOUS

## 2019-12-20 MED ORDER — OXYCODONE-ACETAMINOPHEN 5-325 MG PO TABS
1.0000 | ORAL_TABLET | Freq: Once | ORAL | Status: AC
  Administered 2019-12-20: 1 via ORAL

## 2019-12-20 MED ORDER — ONDANSETRON HCL 4 MG/2ML IJ SOLN
INTRAMUSCULAR | Status: DC | PRN
  Administered 2019-12-20: 4 mg via INTRAVENOUS

## 2019-12-20 MED ORDER — PROPOFOL 10 MG/ML IV BOLUS
INTRAVENOUS | Status: DC | PRN
  Administered 2019-12-20: 100 mg via INTRAVENOUS
  Administered 2019-12-20: 200 mg via INTRAVENOUS

## 2019-12-20 MED ORDER — SCOPOLAMINE 1 MG/3DAYS TD PT72
1.0000 | MEDICATED_PATCH | Freq: Once | TRANSDERMAL | Status: DC
  Administered 2019-12-20: 1.5 mg via TRANSDERMAL

## 2019-12-20 MED ORDER — MORPHINE SULFATE (PF) 4 MG/ML IV SOLN
INTRAVENOUS | Status: DC | PRN
  Administered 2019-12-20: 8 mg via INTRAVENOUS

## 2019-12-20 MED ORDER — CLONIDINE HCL (ANALGESIA) 100 MCG/ML EP SOLN
EPIDURAL | Status: DC | PRN
  Administered 2019-12-20: 100 ug

## 2019-12-20 MED ORDER — MEPERIDINE HCL 25 MG/ML IJ SOLN: 6.2500 mg | INTRAMUSCULAR | Status: DC | PRN

## 2019-12-20 MED ORDER — MORPHINE SULFATE (PF) 4 MG/ML IV SOLN
INTRAVENOUS | Status: AC
  Filled 2019-12-20: qty 2

## 2019-12-20 MED ORDER — FENTANYL CITRATE (PF) 250 MCG/5ML IJ SOLN
INTRAMUSCULAR | Status: AC
  Filled 2019-12-20: qty 5

## 2019-12-20 MED ORDER — MIDAZOLAM HCL 2 MG/2ML IJ SOLN: 0.5000 mg | Freq: Once | INTRAMUSCULAR | Status: DC | PRN

## 2019-12-20 MED ORDER — METHOCARBAMOL 500 MG PO TABS: 500.0000 mg | ORAL_TABLET | Freq: Three times a day (TID) | ORAL | 0 refills | Status: DC | PRN

## 2019-12-20 MED ORDER — ONDANSETRON HCL 4 MG/2ML IJ SOLN
INTRAMUSCULAR | Status: AC
  Filled 2019-12-20: qty 2

## 2019-12-20 MED ORDER — OXYCODONE-ACETAMINOPHEN 5-325 MG PO TABS
ORAL_TABLET | ORAL | Status: AC
  Filled 2019-12-20: qty 1

## 2019-12-20 MED ORDER — PROPOFOL 10 MG/ML IV BOLUS
INTRAVENOUS | Status: AC
  Filled 2019-12-20: qty 20

## 2019-12-20 MED ORDER — SCOPOLAMINE 1 MG/3DAYS TD PT72
MEDICATED_PATCH | TRANSDERMAL | Status: AC
  Filled 2019-12-20: qty 1

## 2019-12-20 MED ORDER — POVIDONE-IODINE 10 % EX SWAB: 2.0000 "application " | Freq: Once | CUTANEOUS | Status: DC

## 2019-12-20 MED ORDER — PROPOFOL 500 MG/50ML IV EMUL
INTRAVENOUS | Status: AC
  Filled 2019-12-20: qty 100

## 2019-12-20 MED ORDER — MIDAZOLAM HCL 2 MG/2ML IJ SOLN
INTRAMUSCULAR | Status: AC
  Filled 2019-12-20: qty 2

## 2019-12-20 SURGICAL SUPPLY — 59 items
BANDAGE ESMARK 6X9 LF (GAUZE/BANDAGES/DRESSINGS) IMPLANT
BLADE EXCALIBUR 4.0MM X 13CM (MISCELLANEOUS)
BLADE EXCALIBUR 4.0X13 (MISCELLANEOUS) IMPLANT
BLADE SURG 15 STRL LF DISP TIS (BLADE) IMPLANT
BLADE SURG 15 STRL SS (BLADE)
BNDG CMPR 9X6 STRL LF SNTH (GAUZE/BANDAGES/DRESSINGS)
BNDG ELASTIC 4X5.8 VLCR STR LF (GAUZE/BANDAGES/DRESSINGS) ×3 IMPLANT
BNDG ELASTIC 6X5.8 VLCR STR LF (GAUZE/BANDAGES/DRESSINGS) ×3 IMPLANT
BNDG ESMARK 6X9 LF (GAUZE/BANDAGES/DRESSINGS)
CLOSURE STERI-STRIP 1/2X4 (GAUZE/BANDAGES/DRESSINGS) ×1
CLSR STERI-STRIP ANTIMIC 1/2X4 (GAUZE/BANDAGES/DRESSINGS) ×2 IMPLANT
COVER WAND RF STERILE (DRAPES) IMPLANT
CUFF TOURN SGL QUICK 34 (TOURNIQUET CUFF)
CUFF TRNQT CYL 34X4.125X (TOURNIQUET CUFF) IMPLANT
DISSECTOR  3.8MM X 13CM (MISCELLANEOUS) ×3
DISSECTOR 3.8MM X 13CM (MISCELLANEOUS) ×1 IMPLANT
DRAPE ARTHROSCOPY W/POUCH 114 (DRAPES) ×3 IMPLANT
DRAPE INCISE IOBAN 66X45 STRL (DRAPES) IMPLANT
DRAPE U-SHAPE 47X51 STRL (DRAPES) ×3 IMPLANT
DRSG PAD ABDOMINAL 8X10 ST (GAUZE/BANDAGES/DRESSINGS) ×3 IMPLANT
DRSG TEGADERM 4X4.75 (GAUZE/BANDAGES/DRESSINGS) ×3 IMPLANT
DURAPREP 26ML APPLICATOR (WOUND CARE) ×3 IMPLANT
DW OUTFLOW CASSETTE/TUBE SET (MISCELLANEOUS) ×3 IMPLANT
ELECT REM PT RETURN 9FT ADLT (ELECTROSURGICAL)
ELECTRODE REM PT RTRN 9FT ADLT (ELECTROSURGICAL) IMPLANT
EXCALIBUR 3.8MM X 13CM (MISCELLANEOUS) IMPLANT
GAUZE 4X4 16PLY RFD (DISPOSABLE) IMPLANT
GAUZE SPONGE 4X4 12PLY STRL (GAUZE/BANDAGES/DRESSINGS) ×3 IMPLANT
GAUZE SPONGE 4X4 12PLY STRL LF (GAUZE/BANDAGES/DRESSINGS) ×3 IMPLANT
GAUZE XEROFORM 1X8 LF (GAUZE/BANDAGES/DRESSINGS) ×3 IMPLANT
GLOVE BIO SURGEON STRL SZ7 (GLOVE) ×3 IMPLANT
GLOVE BIOGEL PI IND STRL 7.0 (GLOVE) ×1 IMPLANT
GLOVE BIOGEL PI IND STRL 8 (GLOVE) ×1 IMPLANT
GLOVE BIOGEL PI INDICATOR 7.0 (GLOVE) ×2
GLOVE BIOGEL PI INDICATOR 8 (GLOVE) ×2
GLOVE SURG ORTHO 8.0 STRL STRW (GLOVE) ×3 IMPLANT
GOWN STRL REUS W/ TWL LRG LVL3 (GOWN DISPOSABLE) ×1 IMPLANT
GOWN STRL REUS W/ TWL XL LVL3 (GOWN DISPOSABLE) ×1 IMPLANT
GOWN STRL REUS W/TWL LRG LVL3 (GOWN DISPOSABLE) ×3
GOWN STRL REUS W/TWL XL LVL3 (GOWN DISPOSABLE) ×3
HOLDER KNEE FOAM BLUE (MISCELLANEOUS) IMPLANT
MANIFOLD NEPTUNE II (INSTRUMENTS) ×3 IMPLANT
NDL SAFETY ECLIPSE 18X1.5 (NEEDLE) ×2 IMPLANT
NEEDLE HYPO 18GX1.5 BLUNT FILL (NEEDLE) ×3 IMPLANT
NEEDLE HYPO 18GX1.5 SHARP (NEEDLE) ×6
PACK ARTHROSCOPY DSU (CUSTOM PROCEDURE TRAY) ×3 IMPLANT
PACK BASIN DAY SURGERY FS (CUSTOM PROCEDURE TRAY) ×3 IMPLANT
PACK ICE MAXI GEL EZY WRAP (MISCELLANEOUS) ×3 IMPLANT
PADDING CAST COTTON 6X4 STRL (CAST SUPPLIES) ×3 IMPLANT
PENCIL SMOKE EVACUATOR (MISCELLANEOUS) IMPLANT
PORT APPOLLO RF 90DEGREE MULTI (SURGICAL WAND) IMPLANT
SUT ETHILON 3 0 PS 1 (SUTURE) ×6 IMPLANT
SUT VIC AB 2-0 SH 27 (SUTURE)
SUT VIC AB 2-0 SH 27XBRD (SUTURE) IMPLANT
SYR 5ML LL (SYRINGE) ×3 IMPLANT
SYR TB 1ML LL NO SAFETY (SYRINGE) IMPLANT
TOWEL GREEN STERILE FF (TOWEL DISPOSABLE) ×3 IMPLANT
TRAY DSU PREP LF (CUSTOM PROCEDURE TRAY) ×3 IMPLANT
TUBING ARTHROSCOPY IRRIG 16FT (MISCELLANEOUS) ×3 IMPLANT

## 2019-12-20 NOTE — Anesthesia Preprocedure Evaluation (Addendum)
Anesthesia Evaluation  Patient identified by MRN, date of birth, ID band Patient awake    Reviewed: Allergy & Precautions, NPO status , Patient's Chart, lab work & pertinent test results  History of Anesthesia Complications Negative for: history of anesthetic complications  Airway Mallampati: II  TM Distance: >3 FB Neck ROM: Full    Dental  (+) Dental Advisory Given   Pulmonary COPD,  COPD inhaler, Current Smoker and Patient abstained from smoking., PE (2011) 12/16/2019 SARS coronavirus NEG   breath sounds clear to auscultation       Cardiovascular (-) angina+ DVT (2011, 2016)   Rhythm:Regular Rate:Normal     Neuro/Psych Anxiety Depression Chronic back and neck pain    GI/Hepatic negative GI ROS, (+)     substance abuse  marijuana use,   Endo/Other  negative endocrine ROS  Renal/GU negative Renal ROS     Musculoskeletal   Abdominal   Peds  Hematology xarelto   Anesthesia Other Findings   Reproductive/Obstetrics                            Anesthesia Physical Anesthesia Plan  ASA: II  Anesthesia Plan: General   Post-op Pain Management:    Induction: Intravenous  PONV Risk Score and Plan: 1 and Ondansetron, Dexamethasone and Scopolamine patch - Pre-op  Airway Management Planned: LMA  Additional Equipment: None  Intra-op Plan:   Post-operative Plan:   Informed Consent: I have reviewed the patients History and Physical, chart, labs and discussed the procedure including the risks, benefits and alternatives for the proposed anesthesia with the patient or authorized representative who has indicated his/her understanding and acceptance.     Dental advisory given  Plan Discussed with: CRNA and Surgeon  Anesthesia Plan Comments:         Anesthesia Quick Evaluation

## 2019-12-20 NOTE — Telephone Encounter (Signed)
See below. I called patient. He wants to know status of his neurosurgery and hand specialist referrals. Can you please check on these?

## 2019-12-20 NOTE — Op Note (Signed)
NAME: [name], [name] MEDICAL RECORD ZO:[id number] ACCOUNT [id number] DATE OF BIRTH:1985-07-24 FACILITY: [facility] LOCATION: [facility] PHYSICIAN:Nikolina Simerson Diamantina Providence, MD  OPERATIVE REPORT  DATE OF PROCEDURE:  12/20/2019  PREOPERATIVE DIAGNOSIS:  Left knee medial meniscal tear.  POSTOPERATIVE DIAGNOSES:  Left knee medial meniscal tear, small lateral meniscal tear.  PROCEDURE:  Left knee arthroscopy with partial medial and lateral meniscectomies.  SURGEON:  Cammy Copa, MD  ASSISTANT:  Karenann Cai, PA  INDICATIONS:  Nicholas Glover is a 34 year old patient with left knee pain and locking.  Presents for operative management after explanation of risks and benefits.  MRI scan shows medial meniscal tear.  PROCEDURE IN DETAIL:  The patient was brought to the operating room where general anesthetic was induced.  Preoperative antibiotics administered.  Timeout was called.  Left leg prescrubbed with alcohol and Betadine, allowed to air dry.  Prepped with  DuraPrep solution and draped in sterile manner.  The left knee was examined under anesthesia and found to have full range of motion, stable collateral and cruciate ligaments at 0 and 30 degrees with intact ACL and PCL.  Following examination under  anesthesia, sterile prepping and draping and calling of timeout, the anterior inferolateral portal and anterior inferomedial portals were anesthetized using 5 mL of Marcaine with epinephrine.  Anterior inferolateral portal was created.  Anterior  inferomedial portal created under direct visualization.  Diagnostic arthroscopy was performed.  The patient did have some wear and tear in the patellofemoral joint with grade II to III changes on the medial and lateral facet of the patella as well as  grade II to III changes in the landing zone in the trochlea.  The ACL and PCL were intact.  The lateral compartment articular cartilage was intact.  The patient did have a small radial tear of that lateral meniscus involving  about 10% anterior, posterior  depth of the meniscus.  This was debrided back to a stable rim using a combination of basket punch and shaver.  On the medial side, the patient had a more complex meniscal tear.  The meniscal tear was primarily posterior.  Had 2 leaflets as well as some  degenerative tissue within the tear itself.  The tear was debrided back to a stable rim.  All in all, about 65% of the anterior, posterior width of that meniscus was involved and was resected over a 2 area posteriorly.  Articular surface on the femur  and tibia intact except for 1 small area medially, which was debrided back using a shaver.  Following meniscal debridement both medially and laterally thorough irrigation was performed.  Instruments were removed.  Portals were closed using 3-0 nylon.   Solution of Marcaine, morphine, clonidine injected into the knee for postop pain relief.  Bulky dressing applied.  The patient will be weightbearing as tolerated and will restart on DVT prophylaxis tonight.  Range of motion exercises also to start  tonight.  Follow up in 7-8 days for suture removal.  Luke's assistance was required at all times for opening and closing, limb positioning, especially.  His assistance was a medical necessity.  CN/NUANCE  D:12/20/2019 T:12/20/2019 JOB:[id number]

## 2019-12-20 NOTE — Anesthesia Procedure Notes (Signed)
Procedure Name: LMA Insertion Date/Time: 12/20/2019 10:27 AM Performed by: Marny Lowenstein, CRNA Patient Re-evaluated:Patient Re-evaluated prior to induction Oxygen Delivery Method: Circle system utilized Preoxygenation: Pre-oxygenation with 100% oxygen Induction Type: IV induction Ventilation: Mask ventilation without difficulty LMA: LMA inserted LMA Size: 5.0 Number of attempts: 1 Placement Confirmation: positive ETCO2 and breath sounds checked- equal and bilateral Tube secured with: Tape Dental Injury: Teeth and Oropharynx as per pre-operative assessment  Comments: Pt with dry, cracked, slightly bloody lips in preop; lube applied after LMA insertion

## 2019-12-20 NOTE — Brief Op Note (Addendum)
   12/20/2019  11:20 AM  PATIENT:  Henreitta Cea  34 y.o. male  PRE-OPERATIVE DIAGNOSIS:  left knee medial meniscal tear  POST-OPERATIVE DIAGNOSIS:  left knee medial meniscal tear And lateral meniscal tear PROCEDURE:  Procedure(s): left knee arthroscopy with medial meniscal debridement and lateral meniscal debridement  SURGEON:  Surgeon(s): Cammy Copa, MD  ASSISTANT: magnant pa  ANESTHESIA:   general  EBL: 5 ml    Total I/O In: 100 [IV Piggyback:100] Out: 5 [Blood:5]  BLOOD ADMINISTERED: none  DRAINS: none   LOCAL MEDICATIONS USED: Marcaine morphine clonidine   SPECIMEN:  No Specimen  COUNTS:  YES  TOURNIQUET:  * Missing tourniquet times found for documented tourniquets in log: [id number] *  DICTATION: .Other Dictation: Dictation Number [id number]  PLAN OF CARE: Discharge to home after PACU  PATIENT DISPOSITION:  PACU - hemodynamically stable

## 2019-12-20 NOTE — Anesthesia Postprocedure Evaluation (Signed)
Anesthesia Post Note  Patient: Nicholas Glover  Procedure(s) Performed: left knee arthroscopy with partial medial and lateral menisectomy (Left Knee)     Patient location during evaluation: Phase II Anesthesia Type: General Level of consciousness: awake and alert, patient cooperative and oriented Pain management: pain level controlled Vital Signs Assessment: post-procedure vital signs reviewed and stable Respiratory status: spontaneous breathing, nonlabored ventilation and respiratory function stable Cardiovascular status: blood pressure returned to baseline and stable Postop Assessment: no apparent nausea or vomiting and adequate PO intake Anesthetic complications: no   No complications documented.  Last Vitals:  Vitals:   12/20/19 1200 12/20/19 1215  BP: 123/73 113/73  Pulse:    Resp: 12 13  Temp:  (!) 36.4 C  SpO2: 100% 100%    Last Pain:  Vitals:   12/20/19 1154  TempSrc:   PainSc: 8                  Jerilee Space,E. Arlisa Leclere

## 2019-12-20 NOTE — Discharge Instructions (Signed)
Knee Arthroscopy, Care After  This sheet gives you information about how to care for yourself after your procedure. Your health care provider may also give you more specific instructions. If you have problems or questions, contact your health care provider.  What can I expect after the procedure?  After the procedure, it is common to have:  · Soreness.  · Swelling.  · Pain that can be relieved by taking pain medicine.  Follow these instructions at home:  Incision care    · Follow instructions from your health care provider about how to take care of your incisions. Make sure you:  ? Wash your hands with soap and water before you change your bandage (dressing). If soap and water are not available, use hand sanitizer.  ? Change your dressing as told by your health care provider.  ? Leave stitches (sutures), staples, skin glue, or adhesive strips in place. These skin closures may need to stay in place for 2 weeks or longer. If adhesive strip edges start to loosen and curl up, you may trim the loose edges. Do not remove adhesive strips completely unless your health care provider tells you to do that.  · Check your incision areas every day for signs of infection. Check for:  ? Redness.  ? More swelling or pain.  ? Fluid or blood.  ? Warmth.  ? Pus or a bad smell.  Bathing  · Do not take baths, swim, or use a hot tub until your health care provider approves. Ask your health care provider if you may take showers. You may only be allowed to take sponge baths.  Activity  · Do not use your knee to support your body weight until your health care provider says that you can. Follow weight-bearing restrictions as told. Use crutches or other devices to help you move around (assistive devices) as directed.  · Ask your health care provider what activities are safe for you during recovery, and what activities you need to avoid.  · If physical therapy was prescribed, do exercises as directed. Doing exercises may help improve knee  movement and flexibility (range of motion).  · Do not lift anything that is heavier than 10 lb (4.5 kg), or the limit that you are told, until your health care provider says that it is safe.  Driving  · Do not drive until your health care provider approves. You may be able to drive after 1-3 weeks.  · Do not drive or use heavy machinery while taking prescription pain medicine.  Managing pain, stiffness, and swelling    · If directed, put ice on the injured area:  ? Put ice in a plastic bag or use the icing device (cold therapy unit) that you were given. Follow instructions from your health care provider about how to use the icing device.  ? Place a towel between your skin and the bag or between your skin and the icing device.  ? Leave the ice on for 20 minutes, 2-3 times a day.  · Move your toes often to avoid stiffness and to lessen swelling.  · Raise (elevate) the injured area above the level of your heart while you are sitting or lying down.  If you are taking blood thinners:  · Before you take any medicines that contain aspirin or NSAIDs, talk with your health care provider. These medicines increase your risk for dangerous bleeding.  · Take your medicine exactly as told, at the same time every day.  ·   as told by your health care provider.  If you are taking prescription pain medicine, take actions to prevent or treat constipation. Your health care provider may recommend that you: ? Drink enough fluid to keep your urine pale yellow. ? Eat foods that are high in fiber, such as fresh fruits and vegetables, whole grains, and beans. ? Limit foods that are high in fat and processed sugars, such as fried or sweet foods. ? Take an over-the-counter  or prescription medicines for constipation.  Do not use any products that contain nicotine or tobacco, such as cigarettes and e-cigarettes. These can delay incision or bone healing. If you need help quitting, ask your health care provider.  Wear compression stockings as told by your health care provider. These stockings help to prevent blood clots and reduce swelling in your legs.  Keep all follow-up visits as told by your health care provider. This is important. Contact a health care provider if you:  Have a fever.  Have severe pain.  Have redness around an incision.  Have more swelling.  Have fluid or blood coming from an incision.  Notice that an incision feels warm to the touch.  Notice pus or a bad smell coming from an incision.  Notice that an incision opens up.  Develop a rash. Get help right away if you:  Have difficulty breathing.  Have shortness of breath.  Have chest pain.  Develop pain in your lower leg or at the back of your knee.  Have numbness or tingling in your lower leg or your foot. Summary  Raise (elevate) the injured area above the level of your heart while you are sitting or lying down.  To help relieve pain and swelling, put ice on your leg for 20 minutes at a time, 2-3 times a day.  If you were prescribed a blood thinner, avoid activities that could cause injury or bruising, and follow instructions about how to prevent falls.  If physical therapy was prescribed, do exercises as directed. Doing exercises may help improve range of motion. This information is not intended to replace advice given to you by your health care provider. Make sure you discuss any questions you have with your health care provider. Document Revised: 04/04/2017 Document Reviewed: 03/05/2017 Elsevier Patient Education  2020 [employer].   Post Anesthesia Home Care Instructions  Activity: Get plenty of rest for the remainder of the day. A responsible individual must  stay with you for 24 hours following the procedure.  For the next 24 hours, DO NOT: -Drive a car -Advertising copywriter -Drink alcoholic beverages -Take any medication unless instructed by your physician -Make any legal decisions or sign important papers.  Meals: Start with liquid foods such as gelatin or soup. Progress to regular foods as tolerated. Avoid greasy, spicy, heavy foods. If nausea and/or vomiting occur, drink only clear liquids until the nausea and/or vomiting subsides. Call your physician if vomiting continues.  Special Instructions/Symptoms: Your throat may feel dry or sore from the anesthesia or the breathing tube placed in your throat during surgery. If this causes discomfort, gargle with warm salt water. The discomfort should disappear within 24 hours.  If you had a scopolamine patch placed behind your ear for the management of post- operative nausea and/or vomiting:  1. The medication in the patch is effective for 72 hours, after which it should be removed.  Wrap patch in a tissue and discard in the trash. Wash hands thoroughly with soap and water. 2. You may  remove the patch earlier than 72 hours if you experience unpleasant side effects which may include dry mouth, dizziness or visual disturbances. 3. Avoid touching the patch. Wash your hands with soap and water after contact with the patch.

## 2019-12-20 NOTE — Transfer of Care (Signed)
Immediate Anesthesia Transfer of Care Note  Patient: Nicholas Glover  Procedure(s) Performed: left knee arthroscopy with medial meniscal debridement (Left Knee)  Patient Location: PACU  Anesthesia Type:General  Level of Consciousness: drowsy  Airway & Oxygen Therapy: Patient Spontanous Breathing and Patient connected to face mask oxygen  Post-op Assessment: Report given to RN and Post -op Vital signs reviewed and stable  Post vital signs: Reviewed and stable  Last Vitals:  Vitals Value Taken Time  BP 110/60 12/20/19 1122  Temp    Pulse 87 12/20/19 1124  Resp 27 12/20/19 1125  SpO2 100 % 12/20/19 1124  Vitals shown include unvalidated device data.  Last Pain:  Vitals:   12/20/19 0924  TempSrc: Oral  PainSc: 0-No pain         Complications: No complications documented.
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
## 2019-12-20 NOTE — Telephone Encounter (Signed)
Patient called requesting a call back. Patient has medical questions. Please call patient at [phone].

## 2019-12-21 NOTE — Telephone Encounter (Signed)
Spoke with patient. Advised see that he is scheduled with Dr. Amanda Pea and Raechel Chute on 9/1 at 2:30pm for the hand specialists (found this information in Proficient) gave patient the phone number to facility, stated this was the first he had been notified of a date/time or location.  I left a voicemail for [place] at [facility] to follow up on referral. Explained to patient I had not heard back yet, but will call back with status.

## 2019-12-21 NOTE — Telephone Encounter (Signed)
FYI - Spoke with patient again, he is aware referral has been refaxed.   Marcelino Duster - referral coordinator at [facility] could not find fax or referral in proficient. I have refaxed information and placed in proficient. She will be looking out for paperwork and contact patient.

## 2019-12-22 ENCOUNTER — Encounter (HOSPITAL_BASED_OUTPATIENT_CLINIC_OR_DEPARTMENT_OTHER): Payer: Self-pay | Admitting: Orthopedic Surgery

## 2019-12-27 ENCOUNTER — Other Ambulatory Visit: Payer: Self-pay | Admitting: Surgical

## 2019-12-27 ENCOUNTER — Ambulatory Visit (INDEPENDENT_AMBULATORY_CARE_PROVIDER_SITE_OTHER): Payer: 59 | Admitting: Surgical

## 2019-12-27 ENCOUNTER — Telehealth: Payer: Self-pay | Admitting: Surgical

## 2019-12-27 DIAGNOSIS — S83242A Other tear of medial meniscus, current injury, left knee, initial encounter: Secondary | ICD-10-CM

## 2019-12-27 MED ORDER — METHOCARBAMOL 500 MG PO TABS: 500.0000 mg | ORAL_TABLET | Freq: Three times a day (TID) | ORAL | 0 refills | Status: AC | PRN

## 2019-12-27 MED ORDER — OXYCODONE-ACETAMINOPHEN 5-325 MG PO TABS: 1.0000 | ORAL_TABLET | Freq: Three times a day (TID) | ORAL | 0 refills | Status: AC | PRN

## 2019-12-27 NOTE — Telephone Encounter (Signed)
Sent to [employer] [facility] [place]

## 2019-12-27 NOTE — Telephone Encounter (Signed)
See below. Please advise.  

## 2019-12-27 NOTE — Telephone Encounter (Signed)
S/w patient and advised

## 2019-12-27 NOTE — Telephone Encounter (Signed)
Patient called. Says he went to the pharmacy to pick up medication and they did not have a RX for Robaxin and tylenol

## 2020-01-02 ENCOUNTER — Encounter: Payer: Self-pay | Admitting: Surgical
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
## 2020-01-03 ENCOUNTER — Telehealth: Payer: Self-pay

## 2020-01-03 NOTE — Telephone Encounter (Signed)
Patient called in wanting to ask questions about his knee . Nicholas Glover he is having some unusual problems , swelling will not go down, lots of pressure around top part. Cant bend . Pain will get worse the more he applies pressure. Also needs doctor note from 7/14 .

## 2020-01-04 NOTE — Telephone Encounter (Signed)
I called patient. No answer.

## 2020-01-05 NOTE — Telephone Encounter (Signed)
Patient called back, appt made

## 2020-01-05 NOTE — Telephone Encounter (Signed)
I left voicemail for patient advising Dr. August Saucer needs to see him in the office on Friday morning. Please make patient an appt when he calls back to schedule. Work note also entered into system from 7/14-9/20 when he was originally scheduled for follow up. Work resumption to be readdressed at 9/20/201 appt.

## 2020-01-05 NOTE — Telephone Encounter (Signed)
Okay for work note.  He should come in on Friday.  Thanks

## 2020-01-05 NOTE — Telephone Encounter (Signed)
I spoke with patient. He is having a lot of swelling and pain anterior knee and just above the knee. He denies pain and swelling into calf. Per last note, patient has resumed Xarelto.  He states that this feels like it did prior to the fluid being drawn off at his last visit. (25cc aspirated 12/27/2019).  Patient would like to know what he needs to do about the swelling and pain.  He is also requesting a work note taking him out of work beginning July 14 until you feel that he is able to resume.  Please advise.

## 2020-01-07 ENCOUNTER — Telehealth: Payer: Self-pay

## 2020-01-07 ENCOUNTER — Encounter: Payer: Self-pay | Admitting: Orthopedic Surgery

## 2020-01-07 ENCOUNTER — Ambulatory Visit (INDEPENDENT_AMBULATORY_CARE_PROVIDER_SITE_OTHER): Payer: 59 | Admitting: Orthopedic Surgery

## 2020-01-07 VITALS — Ht 73.0 in | Wt 184.0 lb

## 2020-01-07 DIAGNOSIS — S83242A Other tear of medial meniscus, current injury, left knee, initial encounter: Secondary | ICD-10-CM

## 2020-01-07 MED ORDER — HYDROCODONE-ACETAMINOPHEN 5-325 MG PO TABS: 1.0000 | ORAL_TABLET | Freq: Three times a day (TID) | ORAL | 0 refills | Status: AC | PRN

## 2020-01-07 MED ORDER — HYDROCODONE-ACETAMINOPHEN 5-325 MG PO TABS: 1.0000 | ORAL_TABLET | Freq: Three times a day (TID) | ORAL | 0 refills | Status: DC | PRN

## 2020-01-07 NOTE — Telephone Encounter (Signed)
Patient would like sent to [facility] at [facility].  Luke aware and sent in.

## 2020-01-07 NOTE — Telephone Encounter (Signed)
[facility] called stating that Rx for Hydrocodone can not be filled at there facility.  Would need to be sent to another pharmacy for patient.  Please advise.  Thank you.
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
## 2020-01-11 ENCOUNTER — Telehealth: Payer: Self-pay | Admitting: Orthopedic Surgery

## 2020-01-11 NOTE — Telephone Encounter (Signed)
Can either of you advise? I spoke with patient. He states the fluid has come back on his knee. He has had it aspirated twice since surgery. Wants to know what he should do about this? Does he need to come back in for repeat aspiration? Also would like to know what can be done so he can start bending his knee?

## 2020-01-11 NOTE — Telephone Encounter (Signed)
Patient called. He would like to speak with Lauren about his knee. His call back number is [phone]

## 2020-01-11 NOTE — Telephone Encounter (Signed)
Ok to bend knee fluid will resorb thx

## 2020-01-11 NOTE — Telephone Encounter (Signed)
IC patient and advised per your note below. Patient seemed very frustrated on the phone stating he is unable to bend the knee at all, when he tries it causes too much pressure. Patient is asking to discuss with you.

## 2020-01-17 NOTE — Telephone Encounter (Signed)
I called less fluid see him in f/u

## 2020-01-18 NOTE — Telephone Encounter (Signed)
noted 

## 2020-01-24 ENCOUNTER — Ambulatory Visit: Payer: 59 | Admitting: Orthopedic Surgery

## 2020-01-29 ENCOUNTER — Encounter: Payer: Self-pay | Admitting: Orthopedic Surgery

## 2020-01-31 NOTE — Telephone Encounter (Signed)
Leg extension exercises with resistance 300 reps 3 times a day thanks

## 2020-02-04 ENCOUNTER — Ambulatory Visit (INDEPENDENT_AMBULATORY_CARE_PROVIDER_SITE_OTHER): Payer: 59 | Admitting: Surgical

## 2020-02-04 DIAGNOSIS — S83242A Other tear of medial meniscus, current injury, left knee, initial encounter: Secondary | ICD-10-CM
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
## 2020-02-06 ENCOUNTER — Encounter: Payer: Self-pay | Admitting: Surgical

## 2020-02-24 ENCOUNTER — Ambulatory Visit: Payer: 59 | Admitting: Family Medicine

## 2020-02-28 ENCOUNTER — Other Ambulatory Visit: Payer: Self-pay

## 2020-02-28 ENCOUNTER — Ambulatory Visit: Payer: 59 | Attending: Internal Medicine | Admitting: Internal Medicine

## 2020-02-28 ENCOUNTER — Encounter: Payer: Self-pay | Admitting: Internal Medicine

## 2020-02-28 VITALS — BP 118/78 | HR 93 | Resp 16 | Wt 182.4 lb

## 2020-02-28 DIAGNOSIS — F411 Generalized anxiety disorder: Secondary | ICD-10-CM

## 2020-02-28 NOTE — Progress Notes (Signed)
Pt states his pain level is described below  8- when walking 0-when sitting  Pt states his pain is coming from left knee, neck and b/l shoulders but that's not the reason why he is here   Pt states the only anxiety medication that helped was xanax
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
## 2020-03-08 ENCOUNTER — Ambulatory Visit: Payer: 59 | Admitting: Orthopedic Surgery

## 2020-03-17 ENCOUNTER — Ambulatory Visit (INDEPENDENT_AMBULATORY_CARE_PROVIDER_SITE_OTHER): Payer: 59 | Admitting: Surgical

## 2020-03-17 DIAGNOSIS — S83242A Other tear of medial meniscus, current injury, left knee, initial encounter: Secondary | ICD-10-CM

## 2020-03-18 ENCOUNTER — Encounter: Payer: Self-pay | Admitting: Surgical
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
## 2020-03-29 ENCOUNTER — Telehealth: Payer: Self-pay | Admitting: Clinical

## 2020-03-29 NOTE — Telephone Encounter (Signed)
LCSW returned call to patient who is interested in counseling, left a VM to discuss further.

## 2020-04-04 ENCOUNTER — Telehealth: Payer: Self-pay | Admitting: Family Medicine

## 2020-04-04 NOTE — Telephone Encounter (Signed)
Tried contacting pt to schedule an appt was unable to reach pt due to phone going to busy signal. Will forward to Dr. Laural Benes for an Lorain Childes

## 2020-04-04 NOTE — Telephone Encounter (Signed)
Pt stated he is waiting on a form from Dr. Laural Benes to be completed and  faxed to [facility] / Pt needs form faxed to them asap about stopping his Xarelto in order to get an injection/ please advise

## 2020-04-05 NOTE — Telephone Encounter (Signed)
I haven't received form

## 2020-04-06 ENCOUNTER — Ambulatory Visit: Payer: 59 | Attending: Neurological Surgery

## 2020-04-19 ENCOUNTER — Other Ambulatory Visit: Payer: Self-pay

## 2020-04-19 ENCOUNTER — Ambulatory Visit (INDEPENDENT_AMBULATORY_CARE_PROVIDER_SITE_OTHER): Payer: 59 | Admitting: Physician Assistant

## 2020-04-19 ENCOUNTER — Encounter (HOSPITAL_COMMUNITY): Payer: Self-pay | Admitting: Physician Assistant

## 2020-04-19 VITALS — BP 122/82 | HR 79 | Ht 72.0 in | Wt 184.0 lb

## 2020-04-19 DIAGNOSIS — F331 Major depressive disorder, recurrent, moderate: Secondary | ICD-10-CM

## 2020-04-19 DIAGNOSIS — F411 Generalized anxiety disorder: Secondary | ICD-10-CM

## 2020-04-19 MED ORDER — BUPROPION HCL ER (XL) 150 MG PO TB24: 150.0000 mg | ORAL_TABLET | ORAL | 2 refills | Status: DC

## 2020-04-19 MED ORDER — GABAPENTIN 300 MG PO CAPS
300.0000 mg | ORAL_CAPSULE | Freq: Three times a day (TID) | ORAL | 2 refills | Status: DC
Start: 2020-04-19 — End: 2020-07-11
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
## 2020-05-26 ENCOUNTER — Telehealth (INDEPENDENT_AMBULATORY_CARE_PROVIDER_SITE_OTHER): Payer: 59 | Admitting: Physician Assistant

## 2020-05-26 ENCOUNTER — Other Ambulatory Visit (HOSPITAL_COMMUNITY): Payer: Self-pay | Admitting: Physician Assistant

## 2020-05-26 ENCOUNTER — Other Ambulatory Visit: Payer: Self-pay

## 2020-05-26 DIAGNOSIS — G4700 Insomnia, unspecified: Secondary | ICD-10-CM | POA: Diagnosis not present

## 2020-05-26 DIAGNOSIS — F331 Major depressive disorder, recurrent, moderate: Secondary | ICD-10-CM | POA: Diagnosis not present

## 2020-05-26 DIAGNOSIS — F411 Generalized anxiety disorder: Secondary | ICD-10-CM | POA: Diagnosis not present

## 2020-05-26 MED ORDER — TRAZODONE HCL 50 MG PO TABS: 50.0000 mg | ORAL_TABLET | Freq: Every day | ORAL | 1 refills | Status: DC

## 2020-06-01 ENCOUNTER — Telehealth (HOSPITAL_COMMUNITY): Payer: Self-pay | Admitting: *Deleted

## 2020-06-01 MED ORDER — TRAZODONE HCL 50 MG PO TABS: 50.0000 mg | ORAL_TABLET | Freq: Every day | ORAL | 1 refills | Status: DC

## 2020-06-01 NOTE — Telephone Encounter (Signed)
Called wanting his medicine moved to [facility] at [address] because his former pharmacy wont take his insurance. Called him to ask him to call receiving pharmacy and have them request the transfer. He said he did that a week ago and it hasnt been transferred yet. Will ask his provider Eddie PA to change his meds to [facility]. He was using [facility] but they cant take insured.

## 2020-06-01 NOTE — Telephone Encounter (Signed)
Provider contacted by Wynona Luna, RN regarding patient's medication. Patient's medication sent to pharmacy of choice.
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
## 2020-06-08 ENCOUNTER — Encounter (HOSPITAL_COMMUNITY): Payer: Self-pay | Admitting: Physician Assistant

## 2020-07-11 ENCOUNTER — Other Ambulatory Visit: Payer: Self-pay

## 2020-07-11 ENCOUNTER — Ambulatory Visit (INDEPENDENT_AMBULATORY_CARE_PROVIDER_SITE_OTHER): Payer: 59 | Admitting: Physician Assistant

## 2020-07-11 ENCOUNTER — Encounter (HOSPITAL_COMMUNITY): Payer: Self-pay | Admitting: Physician Assistant

## 2020-07-11 DIAGNOSIS — F411 Generalized anxiety disorder: Secondary | ICD-10-CM

## 2020-07-11 DIAGNOSIS — G4700 Insomnia, unspecified: Secondary | ICD-10-CM

## 2020-07-11 DIAGNOSIS — F331 Major depressive disorder, recurrent, moderate: Secondary | ICD-10-CM | POA: Diagnosis not present

## 2020-07-11 MED ORDER — BUSPIRONE HCL 7.5 MG PO TABS: 7.5000 mg | ORAL_TABLET | Freq: Two times a day (BID) | ORAL | 1 refills | Status: DC

## 2020-07-11 MED ORDER — BUPROPION HCL ER (XL) 300 MG PO TB24: 300.0000 mg | ORAL_TABLET | ORAL | 2 refills | Status: DC

## 2020-07-11 MED ORDER — TRAZODONE HCL 50 MG PO TABS: 25.0000 mg | ORAL_TABLET | Freq: Every day | ORAL | 1 refills | Status: DC

## 2020-07-11 MED ORDER — GABAPENTIN 300 MG PO CAPS: 300.0000 mg | ORAL_CAPSULE | Freq: Three times a day (TID) | ORAL | 2 refills | Status: DC
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
## 2020-07-17 ENCOUNTER — Telehealth (HOSPITAL_COMMUNITY): Payer: Self-pay | Admitting: Physician Assistant

## 2020-07-17 NOTE — Telephone Encounter (Signed)
Provider was contacted by Nicholas Glover regarding letter request. Patient was contacted and informed of information provider may need in order to create letter.

## 2020-07-17 NOTE — Telephone Encounter (Signed)
Patient called stating that you all had discussed a letter the patient may need. Patient called to confirm that he does need that letter created for him.

## 2020-07-27 ENCOUNTER — Telehealth (HOSPITAL_COMMUNITY): Payer: Self-pay | Admitting: Physician Assistant

## 2020-07-27 NOTE — Telephone Encounter (Signed)
Patient came in to office, requested to see provider about a letter he'd requested. Writer informed patient that he is currently with a patient and any calls for providers will be handle primarily by nurse staff. Patient stated that he was trying to contact the doctor to let him know that he needed 30 days for the letter that they'd spoken about. Patient left and said the provider can contact him if any further information is needed.

## 2020-07-31 ENCOUNTER — Telehealth (HOSPITAL_COMMUNITY): Payer: Self-pay | Admitting: *Deleted

## 2020-07-31 NOTE — Telephone Encounter (Signed)
VM from patient requesting to speak with his provider. Called him back to see what he needed as Nicholas Glover, his provider is out of this office on mondays. Patient was inquiring about a letter he had asked Eddie for, this Clinical research associate unaware of the nature of the letter. Will forward his concern to Eddie on his return to the office.

## 2020-08-03 NOTE — Telephone Encounter (Signed)
Provider was contacted by Wynona Luna, RN regarding patient's message. Provider will contact patient regarding letter.

## 2020-08-16 ENCOUNTER — Telehealth (HOSPITAL_COMMUNITY): Payer: Self-pay | Admitting: *Deleted

## 2020-08-16 NOTE — Telephone Encounter (Signed)
VM left for writer seeking letter from [facility] , will make Eddie aware he is still seeking letter, he has called 4 times previous re this same issue

## 2020-08-17 NOTE — Telephone Encounter (Signed)
Provider will contact Earnestine Mealing regarding patient's letter.

## 2020-08-23 ENCOUNTER — Encounter (HOSPITAL_COMMUNITY): Payer: Self-pay | Admitting: Physician Assistant

## 2020-08-23 ENCOUNTER — Ambulatory Visit (INDEPENDENT_AMBULATORY_CARE_PROVIDER_SITE_OTHER): Payer: 59 | Admitting: Physician Assistant

## 2020-08-23 ENCOUNTER — Other Ambulatory Visit: Payer: Self-pay

## 2020-08-23 DIAGNOSIS — F331 Major depressive disorder, recurrent, moderate: Secondary | ICD-10-CM

## 2020-08-23 DIAGNOSIS — F411 Generalized anxiety disorder: Secondary | ICD-10-CM

## 2020-08-23 DIAGNOSIS — G4700 Insomnia, unspecified: Secondary | ICD-10-CM | POA: Diagnosis not present

## 2020-08-23 MED ORDER — BUPROPION HCL ER (XL) 150 MG PO TB24: 150.0000 mg | ORAL_TABLET | ORAL | 1 refills | Status: AC

## 2020-08-23 MED ORDER — TRAZODONE HCL 50 MG PO TABS: 25.0000 mg | ORAL_TABLET | Freq: Every day | ORAL | 1 refills | Status: AC

## 2020-08-23 MED ORDER — GABAPENTIN 300 MG PO CAPS: 300.0000 mg | ORAL_CAPSULE | Freq: Three times a day (TID) | ORAL | 1 refills | Status: AC

## 2020-08-23 MED ORDER — BUSPIRONE HCL 7.5 MG PO TABS: 7.5000 mg | ORAL_TABLET | Freq: Two times a day (BID) | ORAL | 1 refills | Status: AC
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
## 2020-08-24 ENCOUNTER — Encounter (HOSPITAL_COMMUNITY): Payer: Self-pay | Admitting: Physician Assistant

## 2020-10-05 ENCOUNTER — Encounter (HOSPITAL_COMMUNITY): Payer: 59 | Admitting: Physician Assistant

## 2020-12-12 ENCOUNTER — Encounter (HOSPITAL_COMMUNITY): Payer: 59 | Admitting: Physician Assistant
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
## 2023-05-05 ENCOUNTER — Other Ambulatory Visit (HOSPITAL_BASED_OUTPATIENT_CLINIC_OR_DEPARTMENT_OTHER): Payer: Self-pay
